# Patient Record
Sex: Female | Born: 1990 | Race: White | Hispanic: No | Marital: Single | Smoking: Never smoker
Health system: Southern US, Community
[De-identification: ages and names within clinical notes are randomized; demographics above are authoritative.]

## PROBLEM LIST (undated history)

## (undated) DIAGNOSIS — F419 Anxiety disorder, unspecified: Secondary | ICD-10-CM

## (undated) DIAGNOSIS — N189 Chronic kidney disease, unspecified: Secondary | ICD-10-CM

## (undated) DIAGNOSIS — S83519A Sprain of anterior cruciate ligament of unspecified knee, initial encounter: Secondary | ICD-10-CM

## (undated) HISTORY — PX: NO PAST SURGERIES: SHX2092

---

## 2013-07-20 ENCOUNTER — Emergency Department (HOSPITAL_BASED_OUTPATIENT_CLINIC_OR_DEPARTMENT_OTHER): Payer: BC Managed Care – PPO

## 2013-07-20 ENCOUNTER — Emergency Department (HOSPITAL_BASED_OUTPATIENT_CLINIC_OR_DEPARTMENT_OTHER)
Admission: EM | Admit: 2013-07-20 | Discharge: 2013-07-20 | Disposition: A | Discharge status: Home / Self Care | Payer: BC Managed Care – PPO | Attending: Emergency Medicine | Admitting: Emergency Medicine

## 2013-07-20 ENCOUNTER — Encounter (HOSPITAL_BASED_OUTPATIENT_CLINIC_OR_DEPARTMENT_OTHER): Payer: Self-pay | Admitting: Emergency Medicine

## 2013-07-20 DIAGNOSIS — B9689 Other specified bacterial agents as the cause of diseases classified elsewhere: Secondary | ICD-10-CM

## 2013-07-20 DIAGNOSIS — Z3202 Encounter for pregnancy test, result negative: Secondary | ICD-10-CM | POA: Insufficient documentation

## 2013-07-20 DIAGNOSIS — R109 Unspecified abdominal pain: Secondary | ICD-10-CM | POA: Insufficient documentation

## 2013-07-20 DIAGNOSIS — Z79899 Other long term (current) drug therapy: Secondary | ICD-10-CM | POA: Insufficient documentation

## 2013-07-20 DIAGNOSIS — N760 Acute vaginitis: Secondary | ICD-10-CM | POA: Insufficient documentation

## 2013-07-20 DIAGNOSIS — N390 Urinary tract infection, site not specified: Secondary | ICD-10-CM | POA: Insufficient documentation

## 2013-07-20 DIAGNOSIS — N23 Unspecified renal colic: Secondary | ICD-10-CM

## 2013-07-20 LAB — URINALYSIS, ROUTINE W REFLEX MICROSCOPIC
Bilirubin Urine: NEGATIVE
Glucose, UA: NEGATIVE mg/dL
Ketones, ur: NEGATIVE mg/dL
Nitrite: NEGATIVE
Specific Gravity, Urine: 1.015 (ref 1.005–1.030)
Urobilinogen, UA: 0.2 mg/dL (ref 0.0–1.0)
pH: 6 (ref 5.0–8.0)

## 2013-07-20 LAB — CBC
HCT: 40.8 % (ref 36.0–46.0)
MCH: 27.8 pg (ref 26.0–34.0)
MCV: 83.3 fL (ref 78.0–100.0)
Platelets: 295 10*3/uL (ref 150–400)
RBC: 4.9 MIL/uL (ref 3.87–5.11)
WBC: 9.9 10*3/uL (ref 4.0–10.5)

## 2013-07-20 LAB — URINE MICROSCOPIC-ADD ON

## 2013-07-20 LAB — BASIC METABOLIC PANEL
BUN: 11 mg/dL (ref 6–23)
CO2: 25 mEq/L (ref 19–32)
Calcium: 9.3 mg/dL (ref 8.4–10.5)
Chloride: 102 mEq/L (ref 96–112)
Creatinine, Ser: 0.7 mg/dL (ref 0.50–1.10)
Glucose, Bld: 95 mg/dL (ref 70–99)

## 2013-07-20 LAB — WET PREP, GENITAL
Trich, Wet Prep: NONE SEEN
Yeast Wet Prep HPF POC: NONE SEEN

## 2013-07-20 LAB — PREGNANCY, URINE: Preg Test, Ur: NEGATIVE

## 2013-07-20 MED ORDER — METRONIDAZOLE 500 MG PO TABS: 500.0000 mg | ORAL_TABLET | Freq: Two times a day (BID) | ORAL | Status: AC

## 2013-07-20 MED ORDER — CEPHALEXIN 500 MG PO CAPS: 500.0000 mg | ORAL_CAPSULE | Freq: Three times a day (TID) | ORAL | Status: DC

## 2013-07-20 MED ORDER — KETOROLAC TROMETHAMINE 60 MG/2ML IM SOLN
60.0000 mg | Freq: Once | INTRAMUSCULAR | Status: AC
Start: 2013-07-20 — End: 2013-07-20
  Administered 2013-07-20: 60 mg via INTRAMUSCULAR
  Filled 2013-07-20: qty 2

## 2013-07-20 MED ORDER — PHENAZOPYRIDINE HCL 200 MG PO TABS: 200.0000 mg | ORAL_TABLET | Freq: Two times a day (BID) | ORAL | Status: AC | PRN

## 2013-07-20 NOTE — ED Provider Notes (Signed)
I saw and evaluated the patient, reviewed the resident's note and I agree with the findings and plan. Patient is a 22 year old female who presents to the emergency department with complaints of burning with urination and right flank pain for the past 2 days. She denies any fevers or chills. She denies any nausea vomiting or diarrhea. Last menstrual period was last month and was normal.  On exam, the patient is afebrile and vitals are stable. Heart is regular rate and rhythm and lungs are clear. Abdomen is soft, nontender, nondistended. There is no flank pain. Extremities are without edema. Pelvic exam was performed (please see Dr. Felipa Emory note).  Patient presents here with symptoms of flank pain and urinary frequency which are suspicious for renal calculus or early pyelonephritis. UA reveals RBCs as well as leukocytes. CT scan shows mild fullness of the right renal collecting system. I am uncertain as to whether or not she had a recently passed kidney stone or whether this represents pyelonephritis, however she will be treated with antibiotics and pyridium. We will also prescribe Flagyl as she is noted to have clue cells on her pelvic exam.      Geoffery Lyons, MD 07/20/13 1451

## 2013-07-20 NOTE — ED Provider Notes (Signed)
CSN: [id number]     Arrival date & time 07/20/13  1036 History   First MD Initiated Contact with Patient 07/20/13 1106     Chief Complaint  Patient presents with  . possible uti and low back pain    (Consider location/radiation/quality/duration/timing/severity/associated sxs/prior Treatment) HPI  22 year old female here with 2 days of dysuria and urinary urgency. She states she was seen at her PCPs office one day ago and found to have a negative UA except for blood. Overnight her pain changed from dysuria 2 right-sided flank pain described as a 7/10 achy shooting pain radiating to her right groin at its worst and coming down to 2/10 all the time. Overnight her pains become more generalized in her mid back. She denies fevers, chills, sweats, appetite change. She has seen grossly bloody urine this morning.   History reviewed. No pertinent past medical history. History reviewed. No pertinent past surgical history. History reviewed. No pertinent family history. History  Substance Use Topics  . Smoking status: Never Smoker   . Smokeless tobacco: Not on file  . Alcohol Use: Yes   OB History   Grav Para Term Preterm Abortions TAB SAB Ect Mult Living                 Review of Systems  Constitutional: Negative for fever, chills and appetite change.  HENT: Negative for sore throat.   Respiratory: Negative for cough and shortness of breath.   Cardiovascular: Negative for chest pain.  Gastrointestinal: Negative for abdominal pain.  Genitourinary: Positive for dysuria, hematuria and flank pain.  Musculoskeletal: Positive for back pain.  Skin: Negative for rash.  Neurological: Negative for headaches.  All other systems reviewed and are negative.    Allergies  Review of patient's allergies indicates no known allergies.  Home Medications   Current Outpatient Rx  Name  Route  Sig  Dispense  Refill  . cephALEXin (KEFLEX) 500 MG capsule   Oral   Take 1 capsule (500 mg total) by mouth 3  (three) times daily.   21 capsule   0   . metroNIDAZOLE (FLAGYL) 500 MG tablet   Oral   Take 1 tablet (500 mg total) by mouth 2 (two) times daily.   14 tablet   0   . phenazopyridine (PYRIDIUM) 200 MG tablet   Oral   Take 1 tablet (200 mg total) by mouth 2 (two) times daily as needed for pain.   6 tablet   0    BP 148/96  Pulse 103  Temp(Src) 98.7 F (37.1 C) (Oral)  Resp 20  Ht 5\' 6"  (1.676 m)  Wt 285 lb (129.275 kg)  BMI 46.02 kg/m2  SpO2 97%  LMP 07/03/2013 Physical Exam  Nursing note and vitals reviewed. Constitutional: She is oriented to person, place, and time. She appears well-developed and well-nourished. No distress.  HENT:  Head: Normocephalic and atraumatic.  Eyes: EOM are normal. Pupils are equal, round, and reactive to light.  Neck: Neck supple.  Cardiovascular: Normal rate, regular rhythm and normal heart sounds.   No murmur heard. Pulmonary/Chest: Effort normal and breath sounds normal.  Abdominal: Soft. Bowel sounds are normal. There is no tenderness. There is no guarding.  Genitourinary: There is no rash on the right labia. There is no rash on the left labia. Cervix exhibits no motion tenderness, no discharge and no friability. Right adnexum displays no mass, no tenderness and no fullness. Left adnexum displays no mass, no tenderness and no fullness.  Musculoskeletal:  She exhibits no edema.  Neurological: She is alert and oriented to person, place, and time.  Skin: Skin is warm and dry. She is not diaphoretic.  Psychiatric: She has a normal mood and affect.    ED Course  Procedures (including critical care time) Labs Review Labs Reviewed  WET PREP, GENITAL - Abnormal; Notable for the following:    Clue Cells Wet Prep HPF POC FEW (*)    WBC, Wet Prep HPF POC FEW (*)    All other components within normal limits  URINALYSIS, ROUTINE W REFLEX MICROSCOPIC - Abnormal; Notable for the following:    APPearance CLOUDY (*)    Hgb urine dipstick LARGE (*)     Protein, ur 100 (*)    Leukocytes, UA TRACE (*)    All other components within normal limits  URINE MICROSCOPIC-ADD ON - Abnormal; Notable for the following:    Squamous Epithelial / LPF FEW (*)    Bacteria, UA FEW (*)    All other components within normal limits  GC/CHLAMYDIA PROBE AMP  PREGNANCY, URINE  CBC  BASIC METABOLIC PANEL  RPR  HIV ANTIBODY (ROUTINE TESTING)   Imaging Review Ct Abdomen Pelvis Wo Contrast  07/20/2013   CLINICAL DATA:  UTI symptoms  EXAM: CT ABDOMEN AND PELVIS WITHOUT CONTRAST  TECHNIQUE: Multidetector CT imaging of the abdomen and pelvis was performed following the standard protocol without intravenous contrast.  COMPARISON:  None.  FINDINGS: Lung bases are free of acute infiltrate or effusion.  The liver, gallbladder, spleen, adrenal glands and pancreas are normal in their CT appearance. The left kidney is well visualized without renal calculi. No obstructive changes in the left ureter are seen. The right kidney shows no renal calculi. Mild fullness of the right collecting system and proximal ureter are seen. No definitive right ureteral stone is seen.  The appendix is within normal limits. Mild diverticular change is seen without diverticulitis. The bladder is well distended. No pelvic mass lesion or sidewall abnormality is seen. The osseous structures are grossly unremarkable.  IMPRESSION: Mild fullness of the right renal collecting system and proximal right ureter without definitive stone. No other focal abnormality is noted.   Electronically Signed   By: Alcide Clever M.D.   On: 07/20/2013 12:12    EKG Interpretation   None       MDM   1. Renal colic on right side   2. BV (bacterial vaginosis)   3. UTI (lower urinary tract infection)     22 year old female here with right flank pain and hematuria concerning for renal stone. UA with large blood, trace leukocytes and 21-50 white cells on microscopic exam. CT scan with dilated right urethra but no  apparent stone., Wet prep with few clue cells. CBC and BMP within normal limits  Discussed results with patient and explained that it appears most likely situation is that she has passed a kidney stone which is consistent with her story of sharp flank pain yesterday that he become more generalized back pain today. Also is 21-50 white cells in the urine we'll treat as UTI. Also offered to treat BV with clue cells, patient would prefer to be treated.  Given Rx for Pyridium, Keflex, Flagyl.  Red flags reviewed, diastolic up with PCP for workup for stone if this repeats and per her request for birth control. Return for worsening symptoms.  Murtis Sink, MD [facility] Resident, PGY-2 07/20/2013, 1:02 PM       Elenora Gamma, MD  07/20/13 1302 

## 2013-07-20 NOTE — ED Notes (Signed)
uti symptoms x 3 days states she went to doctor yesterday and they did urinalysis reported that they told her she had some blood in her urine prescribed motrin for pain states she is still having low back pain also states not time for her period but when went to bathroom had some blood with tissue in it

## 2013-07-20 NOTE — ED Notes (Signed)
MD at bedside. 

## 2013-07-21 LAB — URINE CULTURE

## 2013-07-21 LAB — GC/CHLAMYDIA PROBE AMP: CT Probe RNA: NEGATIVE

## 2014-09-24 ENCOUNTER — Encounter (HOSPITAL_BASED_OUTPATIENT_CLINIC_OR_DEPARTMENT_OTHER): Payer: Self-pay | Admitting: Emergency Medicine

## 2014-09-24 ENCOUNTER — Emergency Department (HOSPITAL_BASED_OUTPATIENT_CLINIC_OR_DEPARTMENT_OTHER)
Admission: EM | Admit: 2014-09-24 | Discharge: 2014-09-24 | Disposition: A | Discharge status: Home / Self Care | Payer: BLUE CROSS/BLUE SHIELD | Attending: Emergency Medicine | Admitting: Emergency Medicine

## 2014-09-24 ENCOUNTER — Emergency Department (HOSPITAL_BASED_OUTPATIENT_CLINIC_OR_DEPARTMENT_OTHER): Payer: BLUE CROSS/BLUE SHIELD

## 2014-09-24 DIAGNOSIS — Z792 Long term (current) use of antibiotics: Secondary | ICD-10-CM | POA: Diagnosis not present

## 2014-09-24 DIAGNOSIS — S99912A Unspecified injury of left ankle, initial encounter: Secondary | ICD-10-CM | POA: Diagnosis present

## 2014-09-24 DIAGNOSIS — X58XXXA Exposure to other specified factors, initial encounter: Secondary | ICD-10-CM | POA: Diagnosis not present

## 2014-09-24 DIAGNOSIS — Y9289 Other specified places as the place of occurrence of the external cause: Secondary | ICD-10-CM | POA: Insufficient documentation

## 2014-09-24 DIAGNOSIS — T1490XA Injury, unspecified, initial encounter: Secondary | ICD-10-CM

## 2014-09-24 DIAGNOSIS — Z79899 Other long term (current) drug therapy: Secondary | ICD-10-CM | POA: Diagnosis not present

## 2014-09-24 DIAGNOSIS — S93409A Sprain of unspecified ligament of unspecified ankle, initial encounter: Secondary | ICD-10-CM

## 2014-09-24 DIAGNOSIS — Y9389 Activity, other specified: Secondary | ICD-10-CM | POA: Diagnosis not present

## 2014-09-24 DIAGNOSIS — Y998 Other external cause status: Secondary | ICD-10-CM | POA: Diagnosis not present

## 2014-09-24 DIAGNOSIS — S93402A Sprain of unspecified ligament of left ankle, initial encounter: Secondary | ICD-10-CM | POA: Diagnosis not present

## 2014-09-24 MED ORDER — HYDROCODONE-ACETAMINOPHEN 5-325 MG PO TABS: 2.0000 | ORAL_TABLET | ORAL | Status: DC | PRN

## 2014-09-24 NOTE — ED Provider Notes (Signed)
CSN: [id number]     Arrival date & time 09/24/14  2121 History  This chart was scribed for Rolland PorterMark Lamon Rotundo, MD by Evon Slackerrance Branch, ED Scribe. This patient was seen in room MH11/MH11 and the patient's care was started at 10:09 PM.      Chief Complaint  Patient presents with  . Ankle Injury   The history is provided by the patient. No language interpreter was used.   HPI Comments: Andrea Moon is a 24 y.o. female who presents to the Emergency Department complaining new sudden of left ankle pain onset 1 day prior. Pt states she has associated slight gait problem. Pt state she was walking in high heels and twisted her ankle. Pt doesn't report any other symptoms.   History reviewed. No pertinent past medical history. History reviewed. No pertinent past surgical history. History reviewed. No pertinent family history. History  Substance Use Topics  . Smoking status: Never Smoker   . Smokeless tobacco: Not on file  . Alcohol Use: Yes   OB History    No data available      Review of Systems  Constitutional: Negative for fever, chills, diaphoresis, appetite change and fatigue.  HENT: Negative for mouth sores, sore throat and trouble swallowing.   Eyes: Negative for visual disturbance.  Respiratory: Negative for cough, chest tightness, shortness of breath and wheezing.   Cardiovascular: Negative for chest pain.  Gastrointestinal: Negative for nausea, vomiting, abdominal pain, diarrhea and abdominal distention.  Endocrine: Negative for polydipsia, polyphagia and polyuria.  Genitourinary: Negative for dysuria, frequency and hematuria.  Musculoskeletal: Positive for joint swelling, arthralgias and gait problem.  Skin: Negative for color change, pallor and rash.  Neurological: Negative for dizziness, syncope, light-headedness and headaches.  Hematological: Does not bruise/bleed easily.  Psychiatric/Behavioral: Negative for behavioral problems and confusion.      Allergies  Review of  patient's allergies indicates no known allergies.  Home Medications   Prior to Admission medications   Medication Sig Start Date End Date Taking? Authorizing Provider  cephALEXin (KEFLEX) 500 MG capsule Take 1 capsule (500 mg total) by mouth 3 (three) times daily. 07/20/13   Elenora GammaSamuel L Bradshaw, MD  HYDROcodone-acetaminophen (NORCO/VICODIN) 5-325 MG per tablet Take 2 tablets by mouth every 4 (four) hours as needed. 09/24/14   Rolland PorterMark Breydon Senters, MD  metroNIDAZOLE (FLAGYL) 500 MG tablet Take 1 tablet (500 mg total) by mouth 2 (two) times daily. 07/20/13   Elenora GammaSamuel L Bradshaw, MD  phenazopyridine (PYRIDIUM) 200 MG tablet Take 1 tablet (200 mg total) by mouth 2 (two) times daily as needed for pain. 07/20/13   Elenora GammaSamuel L Bradshaw, MD   BP 135/94 mmHg  Pulse 111  Temp(Src) 98.3 F (36.8 C) (Oral)  Resp 18  Ht 5\' 5"  (1.651 m)  Wt 275 lb (124.739 kg)  BMI 45.76 kg/m2  SpO2 100%  LMP 08/27/2014   Physical Exam  Constitutional: She is oriented to person, place, and time. She appears well-developed and well-nourished. No distress.  HENT:  Head: Normocephalic.  Eyes: Conjunctivae are normal. Pupils are equal, round, and reactive to light. No scleral icterus.  Neck: Normal range of motion. Neck supple. No thyromegaly present.  Cardiovascular: Normal rate and regular rhythm.  Exam reveals no gallop and no friction rub.   No murmur heard. Pulmonary/Chest: Effort normal and breath sounds normal. No respiratory distress. She has no wheezes. She has no rales.  Abdominal: Soft. Bowel sounds are normal. She exhibits no distension. There is no tenderness. There is no rebound.  Musculoskeletal: Normal range of motion. She exhibits no tenderness.  Non tender over 5ht metatarsal or medial foot, soft tissue swelling over ATF ligament. Non tender over proximal fibula.   Neurological: She is alert and oriented to person, place, and time.  Skin: Skin is warm and dry. No rash noted.  Psychiatric: She has a normal mood and  affect. Her behavior is normal.  Nursing note and vitals reviewed.   ED Course  Procedures (including critical care time) DIAGNOSTIC STUDIES: Oxygen Saturation is 100% on RA, normal by my interpretation.    COORDINATION OF CARE: 10:37 PM-Discussed treatment plan with pt at bedside and pt agreed to plan.     Labs Review Labs Reviewed - No data to display  Imaging Review Dg Ankle Complete Left  09/24/2014   CLINICAL DATA:  Pt states she went out last night in a pair of heels and twisted her ankle, pt c.o pain in entire left ankle, swelling  EXAM: LEFT ANKLE COMPLETE - 3+ VIEW  COMPARISON:  None.  FINDINGS: No fracture. Ankle mortise is normally spaced and aligned. No arthropathic change. Mild lateral soft tissue edema.  IMPRESSION: No fracture or ankle joint abnormality.   Electronically Signed   By: Amie Portland M.D.   On: 09/24/2014 22:06     EKG Interpretation None      MDM   Final diagnoses:  Ankle sprain, unspecified laterality, initial encounter   No fracture noted on x-ray.. Discussed care and treatment. Appropriate precautions and follow-up given.   I personally performed the services described in this documentation, which was scribed in my presence. The recorded information has been reviewed and is accurate.      Rolland Porter, MD 09/24/14 2253

## 2014-09-24 NOTE — ED Notes (Signed)
Pt states she went out last night in a pair of heels and twisted her ankle.

## 2014-09-24 NOTE — Discharge Instructions (Signed)

## 2014-11-05 ENCOUNTER — Emergency Department (HOSPITAL_BASED_OUTPATIENT_CLINIC_OR_DEPARTMENT_OTHER): Payer: BLUE CROSS/BLUE SHIELD

## 2014-11-05 ENCOUNTER — Encounter (HOSPITAL_BASED_OUTPATIENT_CLINIC_OR_DEPARTMENT_OTHER): Payer: Self-pay | Admitting: Emergency Medicine

## 2014-11-05 ENCOUNTER — Emergency Department (HOSPITAL_BASED_OUTPATIENT_CLINIC_OR_DEPARTMENT_OTHER)
Admission: EM | Admit: 2014-11-05 | Discharge: 2014-11-05 | Disposition: A | Discharge status: Home / Self Care | Payer: BLUE CROSS/BLUE SHIELD | Attending: Emergency Medicine | Admitting: Emergency Medicine

## 2014-11-05 DIAGNOSIS — Y9289 Other specified places as the place of occurrence of the external cause: Secondary | ICD-10-CM | POA: Insufficient documentation

## 2014-11-05 DIAGNOSIS — S8991XA Unspecified injury of right lower leg, initial encounter: Secondary | ICD-10-CM | POA: Diagnosis present

## 2014-11-05 DIAGNOSIS — Y998 Other external cause status: Secondary | ICD-10-CM | POA: Diagnosis not present

## 2014-11-05 DIAGNOSIS — S86911A Strain of unspecified muscle(s) and tendon(s) at lower leg level, right leg, initial encounter: Secondary | ICD-10-CM

## 2014-11-05 DIAGNOSIS — Y9389 Activity, other specified: Secondary | ICD-10-CM | POA: Diagnosis not present

## 2014-11-05 DIAGNOSIS — S86811A Strain of other muscle(s) and tendon(s) at lower leg level, right leg, initial encounter: Secondary | ICD-10-CM | POA: Insufficient documentation

## 2014-11-05 DIAGNOSIS — X58XXXA Exposure to other specified factors, initial encounter: Secondary | ICD-10-CM | POA: Insufficient documentation

## 2014-11-05 DIAGNOSIS — Z792 Long term (current) use of antibiotics: Secondary | ICD-10-CM | POA: Diagnosis not present

## 2014-11-05 DIAGNOSIS — R52 Pain, unspecified: Secondary | ICD-10-CM

## 2014-11-05 MED ORDER — IBUPROFEN 800 MG PO TABS: 800.0000 mg | ORAL_TABLET | Freq: Three times a day (TID) | ORAL | Status: AC

## 2014-11-05 MED ORDER — IBUPROFEN 800 MG PO TABS
800.0000 mg | ORAL_TABLET | Freq: Once | ORAL | Status: AC
  Administered 2014-11-05: 800 mg via ORAL
  Filled 2014-11-05: qty 1

## 2014-11-05 NOTE — Discharge Instructions (Signed)
Crutch Use Use your crutches to help you to keep weight off of your right leg. Take the medication as needed for pain. Place an ice pack on your right knee 4 times daily for 30 minutes at a time and keep her leg elevated above your heart as much as possible to prevent swelling. Call Dr.Hudnall to schedule an office appointment if you're having significant pain or difficulty walking in 3 days Crutches are used to take weight off one of your legs or feet when you stand or walk. It is important to use crutches that fit properly. When fitted properly:  Each crutch should be 2-3 finger widths below the armpit.  Your weight should be supported by your hand, and not by resting the armpit on the crutch.  RISKS AND COMPLICATIONS Damage to the nerves that extend from your armpit to your hand and arm. To prevent this from happening, make sure your crutches fit properly and do not put pressure on your armpit when using them. HOW TO USE YOUR CRUTCHES If you have been instructed to use partial weight bearing, apply (bear) the amount of weight as your health care provider suggests. Do not bear weight in an amount that causes pain to the area of injury. Walking 1. Step with the crutches. 2. Swing the healthy leg slightly ahead of the crutches. Going Up Steps If there is no handrail: 1. Step up with the healthy leg. 2. Step up with the crutches and injured leg. 3. Continue in this way. If there is a handrail: 1. Hold both crutches in one hand. 2. Place your free hand on the handrail. 3. While putting your weight on your arms, lift your healthy leg to the step. 4. Bring the crutches and the injured leg up to that step. 5. Continue in this way. Going Down Steps Be very careful, as going down stairs with crutches is very challenging. If there is no handrail: 1. Step down with the injured leg and crutches. 2. Step down with the healthy leg. If there is a handrail: 1. Place your hand on the  handrail. 2. Hold both crutches with your free hand. 3. Lower your injured leg and crutch to the step below you. Make sure to keep the crutch tips in the center of the step, never on the edge. 4. Lower your healthy leg to that step. 5. Continue in this way. Standing Up 1. Hold the injured leg forward. 2. Grab the armrest with one hand and the top of the crutches with the other hand. 3. Using these supports, pull yourself up to a standing position. Sitting Down 1. Hold the injured leg forward. 2. Grab the armrest with one hand and the top of the crutches with the other hand. 3. Lower yourself to a sitting position. SEEK MEDICAL CARE IF:  You still feel unsteady on your feet.  You develop new pain, for example in your armpits, back, shoulder, wrist, or hip.  You develop any numbness or tingling. SEEK IMMEDIATE MEDICAL CARE IF: You fall. Document Released: 08/20/2000 Document Revised: 08/28/2013 Document Reviewed: 04/30/2013 [facility] Patient Information 2015 [employer], [place]. This information is not intended to replace advice given to you by your health care provider. Make sure you discuss any questions you have with your health care provider.

## 2014-11-05 NOTE — ED Provider Notes (Signed)
CSN: [id number]     Arrival date & time 11/05/14  4098 History   First MD Initiated Contact with Patient 11/05/14 [phone]     Chief Complaint  Patient presents with  . Knee Pain     (Consider location/radiation/quality/duration/timing/severity/associated sxs/prior Treatment) HPI Complains of right knee pain onset last night when she was "play fighting" pain is worse with moving her knee improved with remaining still. She has been ambulatory since the event. No treatment prior to coming here. Pain moderate at present. History reviewed. No pertinent past medical history. past medical history negative History reviewed. No pertinent past surgical history. History reviewed. No pertinent family history. History  Substance Use Topics  . Smoking status: Never Smoker   . Smokeless tobacco: Not on file  . Alcohol Use: Yes     Comment: "a few times a week"    OB History    No data available     Review of Systems  Constitutional: Negative.   Musculoskeletal: Positive for arthralgias.       Right knee pain      Allergies  Review of patient's allergies indicates no known allergies.  Home Medications   Prior to Admission medications   Medication Sig Start Date End Date Taking? Authorizing Provider  Pseudoeph-Doxylamine-DM-APAP (NYQUIL PO) Take by mouth.   Yes Historical Provider, MD  cephALEXin (KEFLEX) 500 MG capsule Take 1 capsule (500 mg total) by mouth 3 (three) times daily. 07/20/13   Elenora Gamma, MD  HYDROcodone-acetaminophen (NORCO/VICODIN) 5-325 MG per tablet Take 2 tablets by mouth every 4 (four) hours as needed. 09/24/14   Rolland Porter, MD  metroNIDAZOLE (FLAGYL) 500 MG tablet Take 1 tablet (500 mg total) by mouth 2 (two) times daily. 07/20/13   Elenora Gamma, MD  phenazopyridine (PYRIDIUM) 200 MG tablet Take 1 tablet (200 mg total) by mouth 2 (two) times daily as needed for pain. 07/20/13   Elenora Gamma, MD   BP 133/89 mmHg  Pulse 90  Temp(Src) 98.4 F (36.9 C)  (Oral)  Resp 20  Ht  (1.676 m)  Wt 250 lb (113.399 kg)  BMI 40.37 kg/m2  SpO2 100%  LMP 10/26/2014 Physical Exam  Constitutional: She is oriented to person, place, and time. She appears well-developed and well-nourished. No distress.  HENT:  Head: Normocephalic and atraumatic.  Eyes: Conjunctivae and EOM are normal.  Neck: Neck supple. No thyromegaly present.  Cardiovascular: Normal rate.   Pulmonary/Chest: Effort normal.  Abdominal:  Obese  Musculoskeletal: Normal range of motion. She exhibits no edema.  Right lower extremity no swelling no deformity. She complains of knee pain on varus and valgus stress and upon performing Lachman's test and posterior drawer test. No ligamentous laxity. DP pulse 2+.   Neurological: She is alert and oriented to person, place, and time. Coordination normal.  Walks with slight limp favoring right leg  Skin: Skin is warm and dry. No rash noted.  Psychiatric: She has a normal mood and affect.  Nursing note and vitals reviewed.   ED Course  Procedures (including critical care time) Labs Review Labs Reviewed - No data to display  Imaging Review No results found.   EKG Interpretation None     9:45 AM reports no pain relief after treatment with ibuprofen, however she does feel ready to go home. X-ray viewed by me Results for orders placed or performed during the hospital encounter of 07/20/13  GC/Chlamydia Probe Amp  Result Value Ref Range   CT Probe RNA  NEGATIVE NEGATIVE   GC Probe RNA NEGATIVE NEGATIVE  Wet prep, genital  Result Value Ref Range   Yeast Wet Prep HPF POC NONE SEEN NONE SEEN   Trich, Wet Prep NONE SEEN NONE SEEN   Clue Cells Wet Prep HPF POC FEW (A) NONE SEEN   WBC, Wet Prep HPF POC FEW (A) NONE SEEN  Urine culture  Result Value Ref Range   Specimen Description URINE, CLEAN CATCH    Special Requests NONE    Culture  Setup Time      07/20/2013 21:52 [facility]   Culture      Multiple  bacterial morphotypes present, none predominant. Suggest appropriate recollection if clinically indicated. Performed at [facility]   Report Status 07/21/2013 FINAL   Urinalysis, Routine w reflex microscopic  Result Value Ref Range   Color, Urine YELLOW YELLOW   APPearance CLOUDY (A) CLEAR   Specific Gravity, Urine 1.015 1.005 - 1.030   pH 6.0 5.0 - 8.0   Glucose, UA NEGATIVE NEGATIVE mg/dL   Hgb urine dipstick LARGE (A) NEGATIVE   Bilirubin Urine NEGATIVE NEGATIVE   Ketones, ur NEGATIVE NEGATIVE mg/dL   Protein, ur 161100 (A) NEGATIVE mg/dL   Urobilinogen, UA 0.2 0.0 - 1.0 mg/dL   Nitrite NEGATIVE NEGATIVE   Leukocytes, UA TRACE (A) NEGATIVE  Pregnancy, urine  Result Value Ref Range   Preg Test, Ur NEGATIVE NEGATIVE  RPR  Result Value Ref Range   RPR Ser Ql NON REACTIVE NON REACTIVE  HIV antibody  Result Value Ref Range   HIV NON REACTIVE NON REACTIVE  CBC  Result Value Ref Range   WBC 9.9 4.0 - 10.5 K/uL   RBC 4.90 3.87 - 5.11 MIL/uL   Hemoglobin 13.6 12.0 - 15.0 g/dL   HCT 09.640.8 04.536.0 - 40.946.0 %   MCV 83.3 78.0 - 100.0 fL   MCH 27.8 26.0 - 34.0 pg   MCHC 33.3 30.0 - 36.0 g/dL   RDW 81.114.7 91.411.5 - 78.215.5 %   Platelets 295 150 - 400 K/uL  Basic metabolic panel  Result Value Ref Range   Sodium 138 135 - 145 mEq/L   Potassium 3.8 3.5 - 5.1 mEq/L   Chloride 102 96 - 112 mEq/L   CO2 25 19 - 32 mEq/L   Glucose, Bld 95 70 - 99 mg/dL   BUN 11 6 - 23 mg/dL   Creatinine, Ser 9.560.70 0.50 - 1.10 mg/dL   Calcium 9.3 8.4 - 21.310.5 mg/dL   GFR calc non Af Amer >90 >90 mL/min   GFR calc Af Amer >90 >90 mL/min  Urine microscopic-add on  Result Value Ref Range   Squamous Epithelial / LPF FEW (A) RARE   WBC, UA 21-50 <3 WBC/hpf   RBC / HPF TOO NUMEROUS TO COUNT <3 RBC/hpf   Bacteria, UA FEW (A) RARE   Dg Knee Complete 4 Views Right  11/05/2014   CLINICAL DATA:  Knee  EXAM: RIGHT KNEE - COMPLETE 4+ VIEW  COMPARISON:  None.  FINDINGS: Four views of the right knee submitted. No acute  fracture or subluxation. Minimal narrowing of medial joint compartment. No joint effusion.  IMPRESSION: No acute fracture or subluxation. Minimal narrowing of medial joint compartment. No joint effusion.   Electronically Signed   By: Natasha MeadLiviu  Pop M.D.   On: 11/05/2014 09:21    MDM  Patient has crutches Plan prescription ibuprofen Referral Dr. Freddie ApleyHudnall,crutches with partial weight bearing Diagnosis strain of right knee  Final diagnoses:  None        Doug Sou, MD 11/05/14 [phone]

## 2014-11-05 NOTE — ED Notes (Signed)
Pt comes in today with a c/o right knee pain. Pt states that she twisted her right knee while play fighting. Pt states that she can bear some weight on her leg.

## 2014-11-15 ENCOUNTER — Other Ambulatory Visit: Payer: Self-pay | Admitting: Orthopaedic Surgery

## 2014-11-15 DIAGNOSIS — M25561 Pain in right knee: Secondary | ICD-10-CM

## 2014-12-01 ENCOUNTER — Ambulatory Visit
Admission: RE | Admit: 2014-12-01 | Discharge: 2014-12-01 | Disposition: A | Discharge status: Home / Self Care | Payer: BLUE CROSS/BLUE SHIELD | Source: Ambulatory Visit | Attending: Orthopaedic Surgery | Admitting: Orthopaedic Surgery

## 2014-12-01 DIAGNOSIS — M25561 Pain in right knee: Secondary | ICD-10-CM

## 2014-12-03 ENCOUNTER — Other Ambulatory Visit (HOSPITAL_COMMUNITY): Payer: Self-pay | Admitting: Orthopaedic Surgery

## 2014-12-25 ENCOUNTER — Other Ambulatory Visit (HOSPITAL_COMMUNITY): Payer: Self-pay | Admitting: *Deleted

## 2014-12-26 ENCOUNTER — Encounter (HOSPITAL_COMMUNITY)
Admission: RE | Admit: 2014-12-26 | Discharge: 2014-12-26 | Disposition: A | Discharge status: Home / Self Care | Payer: BLUE CROSS/BLUE SHIELD | Source: Ambulatory Visit | Attending: Orthopaedic Surgery | Admitting: Orthopaedic Surgery

## 2014-12-26 ENCOUNTER — Encounter (HOSPITAL_COMMUNITY): Payer: Self-pay

## 2014-12-26 DIAGNOSIS — S83511A Sprain of anterior cruciate ligament of right knee, initial encounter: Secondary | ICD-10-CM | POA: Diagnosis not present

## 2014-12-26 DIAGNOSIS — X58XXXA Exposure to other specified factors, initial encounter: Secondary | ICD-10-CM | POA: Diagnosis not present

## 2014-12-26 DIAGNOSIS — Z6841 Body Mass Index (BMI) 40.0 and over, adult: Secondary | ICD-10-CM | POA: Diagnosis not present

## 2014-12-26 HISTORY — DX: Anxiety disorder, unspecified: F41.9

## 2014-12-26 HISTORY — DX: Sprain of anterior cruciate ligament of unspecified knee, initial encounter: S83.519A

## 2014-12-26 HISTORY — DX: Chronic kidney disease, unspecified: N18.9

## 2014-12-26 LAB — BASIC METABOLIC PANEL
Anion gap: 11 (ref 5–15)
BUN: 10 mg/dL (ref 6–23)
CALCIUM: 9.3 mg/dL (ref 8.4–10.5)
CO2: 22 mmol/L (ref 19–32)
Chloride: 105 mmol/L (ref 96–112)
Creatinine, Ser: 0.58 mg/dL (ref 0.50–1.10)
GFR calc Af Amer: >90 mL/min (ref >90)
Glucose, Bld: 116 mg/dL — ABNORMAL HIGH (ref 70–99)
Potassium: 4 mmol/L (ref 3.5–5.1)
Sodium: 138 mmol/L (ref 135–145)

## 2014-12-26 LAB — CBC
HEMATOCRIT: 41 % (ref 36.0–46.0)
Hemoglobin: 13.6 g/dL (ref 12.0–15.0)
MCH: 28.3 pg (ref 26.0–34.0)
MCHC: 33.2 g/dL (ref 30.0–36.0)
MCV: 85.2 fL (ref 78.0–100.0)
PLATELETS: 303 10*3/uL (ref 150–400)
RBC: 4.81 MIL/uL (ref 3.87–5.11)
RDW: 13.6 % (ref 11.5–15.5)
WBC: 7.9 10*3/uL (ref 4.0–10.5)

## 2014-12-26 LAB — HCG, SERUM, QUALITATIVE: Preg, Serum: NEGATIVE

## 2014-12-26 NOTE — Pre-Procedure Instructions (Signed)
Andrea Moon  12/26/2014   Your procedure is scheduled on:  01/01/2015  [facility]  [facility] A at 10:45 AM.  Call this number if you have problems the morning of surgery: [phone]   Remember:   Do not eat food or drink liquids after midnight.  On TUESDAY   Take these medicines the morning of surgery with A SIP OF WATER:     NONE   Do not wear jewelry, make-up or nail polish.   Do not wear lotions, powders, or perfumes. You may wear deodorant.   Do not shave 48 hours prior to surgery.    Do not bring valuables to the hospital.  [facility]                  for any belongings or valuables.               Contacts, dentures or bridgework may not be worn into surgery.   Leave suitcase in the car. After surgery it may be brought to your room.   For patients admitted to the hospital, discharge time is determined by your                treatment team.               Patients discharged the day of surgery will not be allowed to drive  home.  Name and phone number of your driver: with family  Special Instructions: Special Instructions: [facility] - Preparing for Surgery  Before surgery, you can play an important role.  Because skin is not sterile, your skin needs to be as free of germs as possible.  You can reduce the number of germs on you skin by washing with CHG (chlorahexidine gluconate) soap before surgery.  CHG is an antiseptic cleaner which kills germs and bonds with the skin to continue killing germs even after washing.  Please DO NOT use if you have an allergy to CHG or antibacterial soaps.  If your skin becomes reddened/irritated stop using the CHG and inform your nurse when you arrive at Short Stay.  Do not shave (including legs and underarms) for at least 48 hours prior to the first CHG shower.  You may shave your face.  Please follow these instructions carefully:   1.  Shower with CHG Soap the night before surgery and  the  morning of Surgery.  2.  If you choose to wash your hair, wash your hair first as usual with your  normal shampoo.  3.  After you shampoo, rinse your hair and body thoroughly to remove the  Shampoo.  4.  Use CHG as you would any other liquid soap.  You can apply chg directly to the skin and wash gently with scrungie or a clean washcloth.  5.  Apply the CHG Soap to your body ONLY FROM THE NECK DOWN.    Do not use on open wounds or open sores.  Avoid contact with your eyes, ears, mouth and genitals (private parts).  Wash genitals (private parts)   with your normal soap.  6.  Wash thoroughly, paying special attention to the area where your surgery will be performed.  7.  Thoroughly rinse your body with warm water from the neck down.  8.  DO NOT shower/wash with your normal soap after using and rinsing off   the CHG Soap.  9.  Pat yourself dry with a clean towel.  10.  Wear clean pajamas.            11.  Place clean sheets on your bed the night of your first shower and do not sleep with pets.  Day of Surgery  Do not apply any lotions/deodorants the morning of surgery.  Please wear clean clothes to the hospital/surgery center.   Please read over the following fact sheets that you were given: Pain Booklet, Coughing and Deep Breathing and Surgical Site Infection Prevention

## 2014-12-31 MED ORDER — CEFAZOLIN SODIUM-DEXTROSE 2-3 GM-% IV SOLR
2.0000 g | INTRAVENOUS | Status: AC
  Administered 2015-01-01: 3 g via INTRAVENOUS
  Filled 2014-12-31: qty 50

## 2015-01-01 ENCOUNTER — Ambulatory Visit (HOSPITAL_COMMUNITY): Payer: BLUE CROSS/BLUE SHIELD | Admitting: Certified Registered"

## 2015-01-01 ENCOUNTER — Encounter (HOSPITAL_COMMUNITY)
Admission: RE | Disposition: A | Discharge status: Home / Self Care | Payer: Self-pay | Source: Ambulatory Visit | Attending: Orthopaedic Surgery

## 2015-01-01 ENCOUNTER — Ambulatory Visit (HOSPITAL_COMMUNITY)
Admission: RE | Admit: 2015-01-01 | Discharge: 2015-01-01 | Disposition: A | Discharge status: Home / Self Care | Payer: BLUE CROSS/BLUE SHIELD | Source: Ambulatory Visit | Attending: Orthopaedic Surgery | Admitting: Orthopaedic Surgery

## 2015-01-01 ENCOUNTER — Encounter (HOSPITAL_COMMUNITY): Payer: Self-pay | Admitting: *Deleted

## 2015-01-01 DIAGNOSIS — Z6841 Body Mass Index (BMI) 40.0 and over, adult: Secondary | ICD-10-CM | POA: Insufficient documentation

## 2015-01-01 DIAGNOSIS — X58XXXA Exposure to other specified factors, initial encounter: Secondary | ICD-10-CM | POA: Insufficient documentation

## 2015-01-01 DIAGNOSIS — S83511A Sprain of anterior cruciate ligament of right knee, initial encounter: Secondary | ICD-10-CM | POA: Insufficient documentation

## 2015-01-01 HISTORY — PX: ANTERIOR CRUCIATE LIGAMENT REPAIR: SHX115

## 2015-01-01 SURGERY — RECONSTRUCTION, KNEE, ACL, USING HAMSTRING GRAFT
Anesthesia: General | Site: Knee | Laterality: Right

## 2015-01-01 MED ORDER — METHOCARBAMOL 750 MG PO TABS: 750.0000 mg | ORAL_TABLET | Freq: Two times a day (BID) | ORAL | Status: AC | PRN

## 2015-01-01 MED ORDER — FENTANYL CITRATE (PF) 100 MCG/2ML IJ SOLN
50.0000 ug | INTRAMUSCULAR | Status: DC | PRN
  Administered 2015-01-01: 50 ug via INTRAVENOUS

## 2015-01-01 MED ORDER — KETOROLAC TROMETHAMINE 30 MG/ML IJ SOLN
INTRAMUSCULAR | Status: AC
  Filled 2015-01-01: qty 1

## 2015-01-01 MED ORDER — DEXTROSE 5 % IV SOLN
1000.0000 mg | INTRAVENOUS | Status: AC
  Filled 2015-01-01: qty 10

## 2015-01-01 MED ORDER — HYDROMORPHONE HCL 1 MG/ML IJ SOLN
0.2500 mg | INTRAMUSCULAR | Status: DC | PRN
  Administered 2015-01-01: 0.25 mg via INTRAVENOUS
  Administered 2015-01-01: 1 mg via INTRAVENOUS

## 2015-01-01 MED ORDER — OXYCODONE HCL 5 MG PO TABS
ORAL_TABLET | ORAL | Status: AC
  Filled 2015-01-01: qty 3

## 2015-01-01 MED ORDER — FENTANYL CITRATE (PF) 100 MCG/2ML IJ SOLN: 50.0000 ug | INTRAMUSCULAR | Status: DC | PRN

## 2015-01-01 MED ORDER — KETOROLAC TROMETHAMINE 30 MG/ML IJ SOLN
30.0000 mg | Freq: Once | INTRAMUSCULAR | Status: AC | PRN
  Administered 2015-01-01: 30 mg via INTRAVENOUS

## 2015-01-01 MED ORDER — HYDROMORPHONE HCL 1 MG/ML IJ SOLN
INTRAMUSCULAR | Status: AC
  Administered 2015-01-01: 0.25 mg via INTRAVENOUS
  Filled 2015-01-01: qty 1

## 2015-01-01 MED ORDER — KETOROLAC TROMETHAMINE 30 MG/ML IJ SOLN
INTRAMUSCULAR | Status: DC | PRN
  Administered 2015-01-01: 30 mg via INTRAVENOUS

## 2015-01-01 MED ORDER — OXYCODONE HCL 5 MG PO TABS: 5.0000 mg | ORAL_TABLET | ORAL | Status: AC | PRN

## 2015-01-01 MED ORDER — ONDANSETRON HCL 4 MG/2ML IJ SOLN
INTRAMUSCULAR | Status: DC | PRN
  Administered 2015-01-01: 4 mg via INTRAVENOUS

## 2015-01-01 MED ORDER — SUCCINYLCHOLINE CHLORIDE 20 MG/ML IJ SOLN
INTRAMUSCULAR | Status: DC | PRN
  Administered 2015-01-01: 180 mg via INTRAVENOUS

## 2015-01-01 MED ORDER — FENTANYL CITRATE (PF) 250 MCG/5ML IJ SOLN
INTRAMUSCULAR | Status: AC
  Filled 2015-01-01: qty 5

## 2015-01-01 MED ORDER — FENTANYL CITRATE (PF) 100 MCG/2ML IJ SOLN
INTRAMUSCULAR | Status: DC | PRN
  Administered 2015-01-01: 25 ug via INTRAVENOUS
  Administered 2015-01-01: 150 ug via INTRAVENOUS
  Administered 2015-01-01: 25 ug via INTRAVENOUS
  Administered 2015-01-01: 150 ug via INTRAVENOUS
  Administered 2015-01-01 (×2): 50 ug via INTRAVENOUS

## 2015-01-01 MED ORDER — PHENYLEPHRINE 40 MCG/ML (10ML) SYRINGE FOR IV PUSH (FOR BLOOD PRESSURE SUPPORT)
PREFILLED_SYRINGE | INTRAVENOUS | Status: AC
  Filled 2015-01-01: qty 10

## 2015-01-01 MED ORDER — PROPOFOL 10 MG/ML IV BOLUS
INTRAVENOUS | Status: DC | PRN
  Administered 2015-01-01: 200 mg via INTRAVENOUS

## 2015-01-01 MED ORDER — GLYCOPYRROLATE 0.2 MG/ML IJ SOLN
INTRAMUSCULAR | Status: DC | PRN
  Administered 2015-01-01: .8 mg via INTRAVENOUS

## 2015-01-01 MED ORDER — FENTANYL CITRATE (PF) 100 MCG/2ML IJ SOLN
INTRAMUSCULAR | Status: AC
  Administered 2015-01-01: 50 ug via INTRAVENOUS
  Filled 2015-01-01: qty 2

## 2015-01-01 MED ORDER — NEOSTIGMINE METHYLSULFATE 10 MG/10ML IV SOLN
INTRAVENOUS | Status: AC
Start: 2015-01-01 — End: 2015-01-01
  Filled 2015-01-01: qty 1

## 2015-01-01 MED ORDER — PHENYLEPHRINE HCL 10 MG/ML IJ SOLN
INTRAMUSCULAR | Status: DC | PRN
  Administered 2015-01-01: 120 ug via INTRAVENOUS
  Administered 2015-01-01: 80 ug via INTRAVENOUS

## 2015-01-01 MED ORDER — SENNOSIDES-DOCUSATE SODIUM 8.6-50 MG PO TABS: 1.0000 | ORAL_TABLET | Freq: Every evening | ORAL | Status: AC | PRN

## 2015-01-01 MED ORDER — EPHEDRINE SULFATE 50 MG/ML IJ SOLN
INTRAMUSCULAR | Status: DC | PRN
  Administered 2015-01-01: 10 mg via INTRAVENOUS

## 2015-01-01 MED ORDER — METHOCARBAMOL 500 MG PO TABS
ORAL_TABLET | ORAL | Status: AC
  Filled 2015-01-01: qty 2

## 2015-01-01 MED ORDER — DEXAMETHASONE SODIUM PHOSPHATE 10 MG/ML IJ SOLN
INTRAMUSCULAR | Status: DC | PRN
  Administered 2015-01-01: 4 mg via INTRAVENOUS

## 2015-01-01 MED ORDER — ASPIRIN EC 325 MG PO TBEC
325.0000 mg | DELAYED_RELEASE_TABLET | Freq: Every day | ORAL | Status: AC
Start: 2015-01-01 — End: ?

## 2015-01-01 MED ORDER — HYDROMORPHONE HCL 1 MG/ML IJ SOLN
INTRAMUSCULAR | Status: AC
  Filled 2015-01-01: qty 1

## 2015-01-01 MED ORDER — 0.9 % SODIUM CHLORIDE (POUR BTL) OPTIME
TOPICAL | Status: DC | PRN
  Administered 2015-01-01: 1000 mL

## 2015-01-01 MED ORDER — ROCURONIUM BROMIDE 100 MG/10ML IV SOLN
INTRAVENOUS | Status: DC | PRN
  Administered 2015-01-01: 50 mg via INTRAVENOUS

## 2015-01-01 MED ORDER — OXYCODONE HCL ER 10 MG PO T12A: 10.0000 mg | EXTENDED_RELEASE_TABLET | Freq: Two times a day (BID) | ORAL | Status: AC

## 2015-01-01 MED ORDER — MIDAZOLAM HCL 2 MG/2ML IJ SOLN: 1.0000 mg | INTRAMUSCULAR | Status: DC | PRN

## 2015-01-01 MED ORDER — ONDANSETRON HCL 4 MG/2ML IJ SOLN
INTRAMUSCULAR | Status: AC
  Filled 2015-01-01: qty 4

## 2015-01-01 MED ORDER — LIDOCAINE HCL (CARDIAC) 20 MG/ML IV SOLN
INTRAVENOUS | Status: DC | PRN
  Administered 2015-01-01: 100 mg via INTRAVENOUS

## 2015-01-01 MED ORDER — PROMETHAZINE HCL 25 MG/ML IJ SOLN: 6.2500 mg | INTRAMUSCULAR | Status: DC | PRN

## 2015-01-01 MED ORDER — MIDAZOLAM HCL 2 MG/2ML IJ SOLN
1.0000 mg | INTRAMUSCULAR | Status: DC | PRN
  Administered 2015-01-01: 2 mg via INTRAVENOUS

## 2015-01-01 MED ORDER — LIDOCAINE HCL (CARDIAC) 20 MG/ML IV SOLN
INTRAVENOUS | Status: AC
  Filled 2015-01-01: qty 5

## 2015-01-01 MED ORDER — MIDAZOLAM HCL 2 MG/2ML IJ SOLN
INTRAMUSCULAR | Status: AC
  Administered 2015-01-01: 2 mg via INTRAVENOUS
  Filled 2015-01-01: qty 2

## 2015-01-01 MED ORDER — ROPIVACAINE HCL 5 MG/ML IJ SOLN
INTRAMUSCULAR | Status: DC | PRN
  Administered 2015-01-01: 30 mL via PERINEURAL

## 2015-01-01 MED ORDER — LACTATED RINGERS IV SOLN
INTRAVENOUS | Status: DC
  Administered 2015-01-01 (×2): via INTRAVENOUS

## 2015-01-01 MED ORDER — METHOCARBAMOL 500 MG PO TABS
750.0000 mg | ORAL_TABLET | Freq: Once | ORAL | Status: AC
  Administered 2015-01-01: 750 mg via ORAL

## 2015-01-01 MED ORDER — SODIUM CHLORIDE 0.9 % IR SOLN
Status: DC | PRN
  Administered 2015-01-01: 6000 mL

## 2015-01-01 MED ORDER — NEOSTIGMINE METHYLSULFATE 10 MG/10ML IV SOLN
INTRAVENOUS | Status: DC | PRN
  Administered 2015-01-01: 4 mg via INTRAVENOUS

## 2015-01-01 MED ORDER — OXYCODONE HCL 5 MG PO TABS
15.0000 mg | ORAL_TABLET | Freq: Once | ORAL | Status: AC
  Administered 2015-01-01: 15 mg via ORAL

## 2015-01-01 MED ORDER — GLYCOPYRROLATE 0.2 MG/ML IJ SOLN
INTRAMUSCULAR | Status: AC
  Filled 2015-01-01: qty 4

## 2015-01-01 SURGICAL SUPPLY — 77 items
ANCHOR BUTTON TIGHTROPE ACL RT (Orthopedic Implant) ×6 IMPLANT
BANDAGE ESMARK 6X9 LF (GAUZE/BANDAGES/DRESSINGS) ×1 IMPLANT
BLADE CUDA 5.5 (BLADE) IMPLANT
BLADE CUTTER GATOR 3.5 (BLADE) ×3 IMPLANT
BLADE GREAT WHITE 4.2 (BLADE) ×2 IMPLANT
BLADE GREAT WHITE 4.2MM (BLADE) ×1
BLADE SURG 10 STRL SS (BLADE) ×3 IMPLANT
BLADE SURG 15 STRL LF DISP TIS (BLADE) ×2 IMPLANT
BLADE SURG 15 STRL SS (BLADE) ×4
BNDG ELASTIC 6X15 VLCR STRL LF (GAUZE/BANDAGES/DRESSINGS) ×3 IMPLANT
BNDG ESMARK 6X9 LF (GAUZE/BANDAGES/DRESSINGS) ×3
BUR OVAL 4.0 (BURR) ×3 IMPLANT
BUR OVAL 6.0 (BURR) ×3 IMPLANT
CLOSURE WOUND 1/2 X4 (GAUZE/BANDAGES/DRESSINGS)
COVER SURGICAL LIGHT HANDLE (MISCELLANEOUS) ×3 IMPLANT
CUFF TOURNIQUET SINGLE 34IN LL (TOURNIQUET CUFF) ×3 IMPLANT
CUFF TOURNIQUET SINGLE 44IN (TOURNIQUET CUFF) IMPLANT
DECANTER SPIKE VIAL GLASS SM (MISCELLANEOUS) IMPLANT
DRAPE ARTHROSCOPY W/POUCH 114 (DRAPES) ×3 IMPLANT
DRAPE INCISE IOBAN 66X45 STRL (DRAPES) ×3 IMPLANT
DRAPE U-SHAPE 47X51 STRL (DRAPES) ×3 IMPLANT
DRILL FLIPCUTTER II 10MM (CUTTER) ×1 IMPLANT
DRSG PAD ABDOMINAL 8X10 ST (GAUZE/BANDAGES/DRESSINGS) ×3 IMPLANT
DURAPREP 26ML APPLICATOR (WOUND CARE) ×6 IMPLANT
ELECT REM PT RETURN 9FT ADLT (ELECTROSURGICAL) ×3
ELECTRODE REM PT RTRN 9FT ADLT (ELECTROSURGICAL) ×1 IMPLANT
FLIPCUTTER II 10MM (CUTTER) ×3
GAUZE SPONGE 4X4 12PLY STRL (GAUZE/BANDAGES/DRESSINGS) ×3 IMPLANT
GAUZE XEROFORM 1X8 LF (GAUZE/BANDAGES/DRESSINGS) ×3 IMPLANT
GLOVE BIOGEL PI IND STRL 8 (GLOVE) ×2 IMPLANT
GLOVE BIOGEL PI INDICATOR 8 (GLOVE) ×4
GLOVE ORTHO TXT STRL SZ7.5 (GLOVE) ×3 IMPLANT
GLOVE SURG ORTHO 8.0 STRL STRW (GLOVE) ×3 IMPLANT
GOWN SPEC L3 XXLG W/TWL (GOWN DISPOSABLE) ×3 IMPLANT
GOWN STRL REUS W/ TWL LRG LVL3 (GOWN DISPOSABLE) ×2 IMPLANT
GOWN STRL REUS W/TWL LRG LVL3 (GOWN DISPOSABLE) ×4
IMMOBILIZER KNEE 22 UNIV (SOFTGOODS) ×6 IMPLANT
KIT BASIN OR (CUSTOM PROCEDURE TRAY) ×3 IMPLANT
KIT ROOM TURNOVER OR (KITS) ×3 IMPLANT
MANIFOLD NEPTUNE II (INSTRUMENTS) ×3 IMPLANT
NEEDLE 18GX1X1/2 (RX/OR ONLY) (NEEDLE) ×3 IMPLANT
NS IRRIG 1000ML POUR BTL (IV SOLUTION) ×3 IMPLANT
PACK ARTHROSCOPY DSU (CUSTOM PROCEDURE TRAY) ×3 IMPLANT
PAD ARMBOARD 7.5X6 YLW CONV (MISCELLANEOUS) ×6 IMPLANT
PAD CAST 4YDX4 CTTN HI CHSV (CAST SUPPLIES) IMPLANT
PADDING CAST COTTON 4X4 STRL (CAST SUPPLIES)
PADDING CAST COTTON 6X4 STRL (CAST SUPPLIES) ×3 IMPLANT
PENCIL BUTTON HOLSTER BLD 10FT (ELECTRODE) ×3 IMPLANT
PK GRAFTLINK AUTO IMPLANT SYST (Anchor) ×3 IMPLANT
SET ARTHROSCOPY TUBING (MISCELLANEOUS) ×2
SET ARTHROSCOPY TUBING LN (MISCELLANEOUS) ×1 IMPLANT
SPONGE LAP 4X18 X RAY DECT (DISPOSABLE) ×6 IMPLANT
SPONGE SCRUB IODOPHOR (GAUZE/BANDAGES/DRESSINGS) ×3 IMPLANT
STRIP CLOSURE SKIN 1/2X4 (GAUZE/BANDAGES/DRESSINGS) IMPLANT
SUCTION FRAZIER TIP 10 FR DISP (SUCTIONS) ×3 IMPLANT
SUT 2 FIBERLOOP 20 STRT BLUE (SUTURE) ×3
SUT ETHILON 3 0 PS 1 (SUTURE) ×6 IMPLANT
SUT FIBERWIRE #2 38 T-5 BLUE (SUTURE) ×3
SUT PROLENE 3 0 PS 2 (SUTURE) ×3 IMPLANT
SUT VIC AB 0 CT1 27 (SUTURE) ×2
SUT VIC AB 0 CT1 27XBRD ANBCTR (SUTURE) ×1 IMPLANT
SUT VIC AB 2-0 CT1 27 (SUTURE) ×2
SUT VIC AB 2-0 CT1 TAPERPNT 27 (SUTURE) ×1 IMPLANT
SUT VICRYL 0 UR6 27IN ABS (SUTURE) IMPLANT
SUTURE 2 FIBERLOOP 20 STRT BLU (SUTURE) ×1 IMPLANT
SUTURE FIBERWR #2 38 T-5 BLUE (SUTURE) ×1 IMPLANT
SUTURE TIGERSTICK 2 TIGERWIR 2 (MISCELLANEOUS) ×1 IMPLANT
SYR 30ML LL (SYRINGE) ×3 IMPLANT
SYR BULB IRRIGATION 50ML (SYRINGE) ×3 IMPLANT
SYR TB 1ML LUER SLIP (SYRINGE) ×3 IMPLANT
SYSTEM GRAFT IMPLANT AUTOGRAFT (Anchor) ×1 IMPLANT
TIGERSTICK 2 TIGERWIRE 2 (MISCELLANEOUS) ×3
TOWEL OR 17X24 6PK STRL BLUE (TOWEL DISPOSABLE) ×3 IMPLANT
TOWEL OR 17X26 10 PK STRL BLUE (TOWEL DISPOSABLE) ×3 IMPLANT
UNDERPAD 30X30 INCONTINENT (UNDERPADS AND DIAPERS) IMPLANT
WAND HAND CNTRL MULTIVAC 90 (MISCELLANEOUS) ×3 IMPLANT
WATER STERILE IRR 1000ML POUR (IV SOLUTION) IMPLANT

## 2015-01-01 NOTE — Anesthesia Procedure Notes (Addendum)
Anesthesia Regional Block:  Femoral nerve block  Pre-Anesthetic Checklist: ,, timeout performed, Correct Patient, Correct Site, Correct Laterality, Correct Procedure, Correct Position, site marked, Risks and benefits discussed,  Surgical consent,  Pre-op evaluation,  At surgeon's request and post-op pain management  Laterality: Right  Prep: chloraprep       Needles:  Injection technique: Single-shot  Needle Type: Echogenic Stimulator Needle     Needle Length: 9cm 9 cm Needle Gauge: 21 and 21 G    Additional Needles:  Procedures: ultrasound guided (picture in chart) Femoral nerve block Narrative:  Injection made incrementally with aspirations every 5 mL.  Performed by: Personally   Additional Notes: Patient tolerated the procedure well without complications   Procedure Name: Intubation Date/Time: 01/01/2015 12:55 PM Performed by: Carmela RimaMARTINELLI, Talyn Eddie F Pre-anesthesia Checklist: Timeout performed, Patient being monitored, Suction available, Emergency Drugs available and Patient identified Patient Re-evaluated:Patient Re-evaluated prior to inductionOxygen Delivery Method: Circle system utilized Preoxygenation: Pre-oxygenation with 100% oxygen Intubation Type: IV induction Ventilation: Mask ventilation without difficulty Laryngoscope Size: Mac and 3 Grade View: Grade I Tube size: 7.5 mm Number of attempts: 1 Placement Confirmation: breath sounds checked- equal and bilateral,  positive ETCO2 and ETT inserted through vocal cords under direct vision Secured at: 22 cm Tube secured with: Tape Dental Injury: Teeth and Oropharynx as per pre-operative assessment

## 2015-01-01 NOTE — Progress Notes (Signed)
Orthopedic Tech Progress Note Patient Details:  Andrea Moon 06/02/1991 [id number]  Ortho Devices Type of Ortho Device: Crutches Ortho Device/Splint Interventions: Ordered, Adjustment   Jennye MoccasinHughes, Demani Mcbrien Craig 01/01/2015, 5:56 PM

## 2015-01-01 NOTE — Discharge Instructions (Signed)
Postoperative instructions:  Weightbearing: as tolerated in knee immobilizer  Keep your dressing and/or splint clean and dry at all times.  You can remove your dressing on post-operative day #3 and change with a dry/sterile dressing or Band-Aids as needed thereafter.    Incision instructions:  Do not soak your incision for 3 weeks after surgery.  If the incision gets wet, pat dry and do not scrub the incision.  Pain control:  You have been given a prescription to be taken as directed for post-operative pain control.  In addition, elevate the operative extremity above the heart at all times to prevent swelling and throbbing pain.  Take over-the-counter Colace, 100mg  by mouth twice a day while taking narcotic pain medications to help prevent constipation.  Follow up appointments: 1) 7 days for wound check.   -------------------------------------------------------------------------------------------------------------  After Surgery Pain Control:  After your surgery, post-surgical discomfort or pain is likely. This discomfort can last several days to a few weeks. At certain times of the day your discomfort may be more intense.  Did you receive a nerve block?  A nerve block can provide pain relief for one hour to two days after your surgery. As long as the nerve block is working, you will experience little or no sensation in the area the surgeon operated on.  As the nerve block wears off, you will begin to experience pain or discomfort. It is very important that you begin taking your prescribed pain medication before the nerve block fully wears off. Treating your pain at the first sign of the block wearing off will ensure your pain is better controlled and more tolerable when full-sensation returns. Do not wait until the pain is intolerable, as the medicine will be less effective. It is better to treat pain in advance than to try and catch up.  General Anesthesia:  If you did not receive a nerve  block during your surgery, you will need to start taking your pain medication shortly after your surgery and should continue to do so as prescribed by your surgeon.  Pain Medication:  Most commonly we prescribe Vicodin and Percocet for post-operative pain. Both of these medications contain a combination of acetaminophen (Tylenol) and a narcotic to help control pain.   It takes between 30 and 45 minutes before pain medication starts to work. It is important to take your medication before your pain level gets too intense.   Nausea is a common side effect of many pain medications. You will want to eat something before taking your pain medicine to help prevent nausea.   If you are taking a prescription pain medication that contains acetaminophen, we recommend that you do not take additional over the counter acetaminophen (Tylenol).  Other pain relieving options:   Using a cold pack to ice the affected area a few times a day (15 to 20 minutes at a time) can help to relieve pain, reduce swelling and bruising.   Elevation of the affected area can also help to reduce pain and swelling.

## 2015-01-01 NOTE — Op Note (Signed)
Date of procedure: 01/01/2015  Preoperative diagnosis: Right knee anterior cruciate ligament tear ex  Postoperative diagnosis: Same  Procedure: Arthroscopically assisted reconstruction of the anterior cruciate ligament of right knee with semitendinosus autograft  Surgeon: Glee ArvinMichael Xu, M.D.  Assistant: April Green, RNFA  Anesthesia: Gen. and regional  Estimated blood loss: Minimal  Tourniquet time: 1 hour 48 minutes  Complications: None  Condition to PACU: Stable  Indications for procedure: Andrea Moon is a 24 year old female who presents for surgical treatment of an anterior cruciate ligament tear. She was aware of the risks benefits alternatives to surgery and she wished to proceed. The consent was signed.  Description of procedure: The patient was identified in the preoperative holding area. The operative site was marked by the surgeon confirmed with the patient. She is brought back to the operating room. She was placed supine on the table. General anesthesia was induced. Nonsterile tourniquet was placed on the upper right thigh. Right lower extremity was prepped and draped in standard sterile fashion. Timeout was performed pre-incisional antibiotics were given. An exam under anesthesia of the right knee was performed and showed a positive pivot shift sign and a 2+ Lachman's with a soft endpoint. Extremity was exsanguinated using Esmarch bandage and the tourniquet was inflated to 350 mmHg. We first began with the hamstring harvesting. A longitudinal incision approximately 2 cm medial to the tibial tubercle was used. Blunt dissection was taken down to the level of the sartorius fascia. The semitendinosus tendon was identified. This was then isolated and adhesions were broken up using blunt dissection. Once we felt the adhesions had been broken up adequately we then used a tendon stripper to harvest the tendon. The tendon was then taken to the back table and prepared by my assistant. I then  continued with the arthroscopic portion. The standard anteromedial anterolateral portals of the knee were established. We visualized the anterior cruciate ligament tear. This was debrided using oscillating shaver. The femoral notch appeared to be quite narrow anatomically. Its we felt we had the anterior cruciate ligament footprint debrided we then performed a notchplasty with a high-speed burr. The medial aspect of the lateral femoral condyle was prepared. Once this was done we then found the proper tunnel on the femoral side using the guide and arthroscopic visualization. We then drilled a femoral tunnel approximately 25 mm in length and 10 mm in diameter. This was done using a flip cutter. We then performed the same on the tibia side. The medial arthroscopic portal was then dilated to accommodate the size of the graft. The prepared graft was 10 mm in diameter. We then first advanced the graft up the femoral tunnel under arthroscopic visualization with a button. The button was then flipped.  We then advanced approximately 15 mm of the graft into the femoral tunnel. We then advanced the graft into the tibial tunnel in a similar fashion. A large dogma was placed on the outside portion of the tibia. The graft was also advanced down into the tibial tunnel approximately 15 mm. We tightened down the graft.  We placed the knee in full extension and showed there was no impingement of the graft on the femoral notch. We then performed another exam under anesthesia and this time there was no pivot shift and her Lachman's was 1+ with a firm endpoint. The wounds were thoroughly irrigated and closed in layer fashion using 0 Vicryl for the fascia, 2-0 Vicryl for the subcutaneous layer and 3-0 nylon for the skin. Sterile dressings were applied.  The tourniquet was deflated. Patient was placed in the immobilizer and was extubated and transferred to the PACU in stable condition. All sponge counts were correct  Postoperative plan:  Patient will be weight bear as tolerated in a knee immobilizer. She will increase activity as tolerated. We'll see her back in the office in 1 week to start the CPM.  Mayra Reel, MD [facility] [phone] 3:30 PM

## 2015-01-01 NOTE — Anesthesia Preprocedure Evaluation (Addendum)
Anesthesia Evaluation  Patient identified by MRN, date of birth, ID band Patient awake    Reviewed: Allergy & Precautions, NPO status , Patient's Chart, lab work & pertinent test results  Airway Mallampati: II  TM Distance: <3 FB Neck ROM: Full    Dental no notable dental hx.    Pulmonary neg pulmonary ROS,  breath sounds clear to auscultation  Pulmonary exam normal       Cardiovascular negative cardio ROS  Rhythm:Regular Rate:Normal     Neuro/Psych negative neurological ROS  negative psych ROS   GI/Hepatic negative GI ROS, Neg liver ROS,   Endo/Other  Morbid obesity  Renal/GU negative Renal ROS  negative genitourinary   Musculoskeletal negative musculoskeletal ROS (+)   Abdominal   Peds negative pediatric ROS (+)  Hematology negative hematology ROS (+)   Anesthesia Other Findings   Reproductive/Obstetrics negative OB ROS                             Anesthesia Physical Anesthesia Plan  ASA: III  Anesthesia Plan: General   Post-op Pain Management:    Induction: Intravenous  Airway Management Planned: Oral ETT  Additional Equipment:   Intra-op Plan:   Post-operative Plan: Extubation in OR  Informed Consent: I have reviewed the patients History and Physical, chart, labs and discussed the procedure including the risks, benefits and alternatives for the proposed anesthesia with the patient or authorized representative who has indicated his/her understanding and acceptance.   Dental advisory given  Plan Discussed with: CRNA and Surgeon  Anesthesia Plan Comments:        Anesthesia Quick Evaluation

## 2015-01-01 NOTE — H&P (Signed)
PREOPERATIVE H&P  Chief Complaint: right knee anterior cruciate ligament tear  HPI: Andrea Moon is a 24 y.o. female who presents for surgical treatment of right knee anterior cruciate ligament tear.  She denies any changes in medical history.  Past Medical History  Diagnosis Date  . Anxiety     has had counselling for anxiety in the past   . Chronic kidney disease     renal calculi - x1 in the past- passed spontaneously  . ACL tear     right    Past Surgical History  Procedure Laterality Date  . No past surgeries     History   Social History  . Marital Status: Single    Spouse Name: N/A  . Number of Children: N/A  . Years of Education: N/A   Social History Main Topics  . Smoking status: Never Smoker   . Smokeless tobacco: Not on file  . Alcohol Use: Yes     Comment: "a few times a week"   . Drug Use: Yes    Special: Marijuana     Comment: everyday - at night   . Sexual Activity: Yes   Other Topics Concern  . Not on file   Social History Narrative   No family history on file. No Known Allergies Prior to Admission medications   Medication Sig Start Date End Date Taking? Authorizing Provider  ibuprofen (ADVIL,MOTRIN) 200 MG tablet Take 400 mg by mouth every 6 (six) hours as needed for moderate pain.   Yes Historical Provider, MD  cephALEXin (KEFLEX) 500 MG capsule Take 1 capsule (500 mg total) by mouth 3 (three) times daily. Patient not taking: Reported on 12/24/2014 07/20/13   Elenora GammaSamuel L Bradshaw, MD  HYDROcodone-acetaminophen (NORCO/VICODIN) 5-325 MG per tablet Take 2 tablets by mouth every 4 (four) hours as needed. Patient not taking: Reported on 12/24/2014 09/24/14   Rolland PorterMark James, MD  ibuprofen (ADVIL,MOTRIN) 800 MG tablet Take 1 tablet (800 mg total) by mouth 3 (three) times daily. Patient not taking: Reported on 12/24/2014 11/05/14   Doug SouSam Jacubowitz, MD  metroNIDAZOLE (FLAGYL) 500 MG tablet Take 1 tablet (500 mg total) by mouth 2 (two) times daily. Patient not  taking: Reported on 12/24/2014 07/20/13   Elenora GammaSamuel L Bradshaw, MD  phenazopyridine (PYRIDIUM) 200 MG tablet Take 1 tablet (200 mg total) by mouth 2 (two) times daily as needed for pain. Patient not taking: Reported on 12/24/2014 07/20/13   Elenora GammaSamuel L Bradshaw, MD     Positive ROS: All other systems have been reviewed and were otherwise negative with the exception of those mentioned in the HPI and as above.  Physical Exam: General: Alert, no acute distress Cardiovascular: No pedal edema Respiratory: No cyanosis, no use of accessory musculature GI: abdomen soft Skin: No lesions in the area of chief complaint Neurologic: Sensation intact distally Psychiatric: Patient is competent for consent with normal mood and affect Lymphatic: no lymphedema  MUSCULOSKELETAL: exam stable  Assessment: right knee anterior cruciate ligament tear  Plan: Plan for Procedure(s): RECONSTRUCTION RIGHT KNEE ANTERIOR CRUCIATE LIGAMENT (ACL) WITH HAMSTRING AUTOGRAFT  The risks benefits and alternatives were discussed with the patient including but not limited to the risks of nonoperative treatment, versus surgical intervention including infection, bleeding, nerve injury,  blood clots, cardiopulmonary complications, morbidity, mortality, among others, and they were willing to proceed.   Cheral AlmasXu, Naiping Michael, MD   01/01/2015 8:57 AM  '

## 2015-01-01 NOTE — Transfer of Care (Signed)
Immediate Anesthesia Transfer of Care Note  Patient: [name]  Procedure(s) Performed: Procedure(s): RECONSTRUCTION RIGHT KNEE ANTERIOR CRUCIATE LIGAMENT (ACL) WITH HAMSTRING AUTOGRAFT (Right)  Patient Location: PACU  Anesthesia Type:General  Level of Consciousness: awake, alert  and oriented  Airway & Oxygen Therapy: Patient Spontanous Breathing and Patient connected to nasal cannula oxygen  Post-op Assessment: Report given to RN, Post -op Vital signs reviewed and stable and Patient moving all extremities X 4  Post vital signs: Reviewed and stable  Last Vitals:  Filed Vitals:   01/01/15 1046  BP: 132/91  Pulse: 100  Temp: 36.9 C  Resp: 20    Complications: No apparent anesthesia complications

## 2015-01-02 ENCOUNTER — Encounter (HOSPITAL_COMMUNITY): Payer: Self-pay | Admitting: Orthopaedic Surgery

## 2015-01-02 NOTE — Anesthesia Postprocedure Evaluation (Signed)
  Anesthesia Post-op Note  Patient: [name]  Procedure(s) Performed: Procedure(s) (LRB): RECONSTRUCTION RIGHT KNEE ANTERIOR CRUCIATE LIGAMENT (ACL) WITH HAMSTRING AUTOGRAFT (Right)  Patient Location: PACU  Anesthesia Type: GA combined with regional for post-op pain  Level of Consciousness: awake and alert   Airway and Oxygen Therapy: Patient Spontanous Breathing  Post-op Pain: mild  Post-op Assessment: Post-op Vital signs reviewed, Patient's Cardiovascular Status Stable, Respiratory Function Stable, Patent Airway and No signs of Nausea or vomiting  Last Vitals:  Filed Vitals:   01/01/15 1735  BP: 150/92  Pulse: 107  Temp: 36.9 C  Resp: 21    Post-op Vital Signs: stable   Complications: No apparent anesthesia complications

## 2015-01-08 ENCOUNTER — Encounter (HOSPITAL_COMMUNITY): Payer: Self-pay | Admitting: Orthopaedic Surgery

## 2016-12-03 ENCOUNTER — Encounter (HOSPITAL_COMMUNITY): Payer: Self-pay | Admitting: Emergency Medicine

## 2016-12-03 ENCOUNTER — Ambulatory Visit (HOSPITAL_COMMUNITY)
Admission: EM | Admit: 2016-12-03 | Discharge: 2016-12-03 | Disposition: A | Discharge status: Other Acute Inpatient Hospital | Payer: BLUE CROSS/BLUE SHIELD

## 2016-12-03 ENCOUNTER — Emergency Department (HOSPITAL_COMMUNITY)
Admission: EM | Admit: 2016-12-03 | Discharge: 2016-12-03 | Disposition: A | Discharge status: Home / Self Care | Payer: BLUE CROSS/BLUE SHIELD | Attending: Emergency Medicine | Admitting: Emergency Medicine

## 2016-12-03 ENCOUNTER — Emergency Department (HOSPITAL_COMMUNITY): Payer: BLUE CROSS/BLUE SHIELD

## 2016-12-03 DIAGNOSIS — R1011 Right upper quadrant pain: Secondary | ICD-10-CM

## 2016-12-03 DIAGNOSIS — N189 Chronic kidney disease, unspecified: Secondary | ICD-10-CM | POA: Insufficient documentation

## 2016-12-03 DIAGNOSIS — Z7982 Long term (current) use of aspirin: Secondary | ICD-10-CM | POA: Insufficient documentation

## 2016-12-03 DIAGNOSIS — N390 Urinary tract infection, site not specified: Secondary | ICD-10-CM

## 2016-12-03 DIAGNOSIS — R1013 Epigastric pain: Secondary | ICD-10-CM

## 2016-12-03 DIAGNOSIS — K8020 Calculus of gallbladder without cholecystitis without obstruction: Secondary | ICD-10-CM

## 2016-12-03 LAB — LIPASE, BLOOD: Lipase: 18 U/L (ref 11–51)

## 2016-12-03 LAB — URINALYSIS, ROUTINE W REFLEX MICROSCOPIC
Bilirubin Urine: NEGATIVE
Glucose, UA: NEGATIVE mg/dL
Ketones, ur: NEGATIVE mg/dL
Leukocytes, UA: NEGATIVE
Nitrite: POSITIVE — AB
PH: 6 (ref 5.0–8.0)
Protein, ur: 30 mg/dL — AB
Specific Gravity, Urine: 1.018 (ref 1.005–1.030)

## 2016-12-03 LAB — CBC
HEMATOCRIT: 37 % (ref 36.0–46.0)
Hemoglobin: 12 g/dL (ref 12.0–15.0)
MCH: 25.8 pg — ABNORMAL LOW (ref 26.0–34.0)
MCHC: 32.4 g/dL (ref 30.0–36.0)
MCV: 79.4 fL (ref 78.0–100.0)
Platelets: 328 10*3/uL (ref 150–400)
RBC: 4.66 MIL/uL (ref 3.87–5.11)
RDW: 14.1 % (ref 11.5–15.5)
WBC: 11.1 10*3/uL — AB (ref 4.0–10.5)

## 2016-12-03 LAB — POC URINE PREG, ED: PREG TEST UR: NEGATIVE

## 2016-12-03 LAB — COMPREHENSIVE METABOLIC PANEL
ALK PHOS: 52 U/L (ref 38–126)
ALT: 26 U/L (ref 14–54)
AST: 23 U/L (ref 15–41)
Albumin: 3.8 g/dL (ref 3.5–5.0)
Anion gap: 10 (ref 5–15)
BILIRUBIN TOTAL: 0.5 mg/dL (ref 0.3–1.2)
BUN: 9 mg/dL (ref 6–20)
CHLORIDE: 103 mmol/L (ref 101–111)
CO2: 24 mmol/L (ref 22–32)
Calcium: 9.2 mg/dL (ref 8.9–10.3)
Creatinine, Ser: 0.59 mg/dL (ref 0.44–1.00)
GFR calc Af Amer: >60 mL/min (ref >60)
GLUCOSE: 110 mg/dL — AB (ref 65–99)
Potassium: 3.8 mmol/L (ref 3.5–5.1)
Sodium: 137 mmol/L (ref 135–145)
Total Protein: 7.1 g/dL (ref 6.5–8.1)

## 2016-12-03 MED ORDER — CEPHALEXIN 500 MG PO CAPS: 1000.0000 mg | ORAL_CAPSULE | Freq: Two times a day (BID) | ORAL | 0 refills | Status: AC

## 2016-12-03 MED ORDER — GI COCKTAIL ~~LOC~~
30.0000 mL | Freq: Once | ORAL | Status: AC
  Administered 2016-12-03: 30 mL via ORAL
  Filled 2016-12-03: qty 30

## 2016-12-03 MED ORDER — TRAMADOL HCL 50 MG PO TABS: 50.0000 mg | ORAL_TABLET | Freq: Four times a day (QID) | ORAL | 0 refills | Status: AC | PRN

## 2016-12-03 MED ORDER — ONDANSETRON HCL 4 MG/2ML IJ SOLN
4.0000 mg | Freq: Once | INTRAMUSCULAR | Status: AC
  Administered 2016-12-03: 4 mg via INTRAVENOUS
  Filled 2016-12-03: qty 2

## 2016-12-03 MED ORDER — RANITIDINE HCL 150 MG PO CAPS: 150.0000 mg | ORAL_CAPSULE | Freq: Two times a day (BID) | ORAL | 0 refills | Status: AC

## 2016-12-03 MED ORDER — OMEPRAZOLE 20 MG PO CPDR: 20.0000 mg | DELAYED_RELEASE_CAPSULE | Freq: Two times a day (BID) | ORAL | 0 refills | Status: AC

## 2016-12-03 MED ORDER — MORPHINE SULFATE (PF) 4 MG/ML IV SOLN
6.0000 mg | Freq: Once | INTRAVENOUS | Status: AC
  Administered 2016-12-03: 6 mg via INTRAVENOUS
  Filled 2016-12-03: qty 2

## 2016-12-03 NOTE — ED Provider Notes (Signed)
[facility]-EMERGENCY DEPT Provider Note   CSN: [id number] Arrival date & time: 12/03/16  1029     History   Chief Complaint Chief Complaint  Patient presents with  . Abdominal Pain    HPI Andrea Moon is a 26 y.o. female.  HPI   26 year old female presenting complaining of abdominal pain. Patient reports she was awoke this morning around 12:32 AM with intense burning sensation to her epigastric region that spread throughout her chest. States she became clammy, nauseous and did try to vomit a couple times. Her pain has since improved, currently rates as 6 out of 10. She has never had this kind of pain before. She denies any associated fever, chills, lightheadedness, dizziness, exertional chest pain, shortness of breath, productive cough, hemoptysis, dysuria, hematuria, vaginal bleeding vaginal discharge, constipation or diarrhea. Denies any rash. She ate a pesto pizza the night before. She denies any alcohol abuse. She did tries taking Tums, and ibuprofen earlier in the day with minimal improvement. Her last menstrual period was at the beginning of the week.  Past Medical History:  Diagnosis Date  . ACL tear    right   . Anxiety    has had counselling for anxiety in the past   . Chronic kidney disease    renal calculi - x1 in the past- passed spontaneously    There are no active problems to display for this patient.   Past Surgical History:  Procedure Laterality Date  . ANTERIOR CRUCIATE LIGAMENT REPAIR Right 01/01/2015   Procedure: RECONSTRUCTION RIGHT KNEE ANTERIOR CRUCIATE LIGAMENT (ACL) WITH HAMSTRING AUTOGRAFT;  Surgeon: Tarry Kos, MD;  Location: [facility] OR;  Service: Orthopedics;  Laterality: Right;  . NO PAST SURGERIES      OB History    No data available       Home Medications    Prior to Admission medications   Medication Sig Start Date End Date Taking? Authorizing Provider  aspirin EC 325 MG tablet Take 1 tablet (325 mg total) by mouth daily. 01/01/15   Naiping Donnelly Stager, MD  cephALEXin (KEFLEX) 500 MG capsule Take 1 capsule (500 mg total) by mouth 3 (three) times daily. Patient not taking: Reported on 12/24/2014 07/20/13   Elenora Gamma, MD  ibuprofen (ADVIL,MOTRIN) 200 MG tablet Take 400 mg by mouth every 6 (six) hours as needed for moderate pain.    Historical Provider, MD  ibuprofen (ADVIL,MOTRIN) 800 MG tablet Take 1 tablet (800 mg total) by mouth 3 (three) times daily. Patient not taking: Reported on 12/24/2014 11/05/14   Doug Sou, MD  methocarbamol (ROBAXIN) 750 MG tablet Take 1 tablet (750 mg total) by mouth 2 (two) times daily as needed for muscle spasms. 01/01/15   Naiping Donnelly Stager, MD  metroNIDAZOLE (FLAGYL) 500 MG tablet Take 1 tablet (500 mg total) by mouth 2 (two) times daily. Patient not taking: Reported on 12/24/2014 07/20/13   Elenora Gamma, MD  oxyCODONE (OXY IR/ROXICODONE) 5 MG immediate release tablet Take 1-3 tablets (5-15 mg total) by mouth every 4 (four) hours as needed. 01/01/15   Tarry Kos, MD  OxyCODONE (OXYCONTIN) 10 mg T12A 12 hr tablet Take 1 tablet (10 mg total) by mouth every 12 (twelve) hours. 01/01/15   Naiping Donnelly Stager, MD  phenazopyridine (PYRIDIUM) 200 MG tablet Take 1 tablet (200 mg total) by mouth 2 (two) times daily as needed for pain. Patient not taking: Reported on 12/24/2014 07/20/13   Elenora Gamma, MD  senna-docusate [facility] S) 8.6-50  MG per tablet Take 1 tablet by mouth at bedtime as needed. 01/01/15   Naiping Donnelly Stager, MD    Family History No family history on file.  Social History Social History  Substance Use Topics  . Smoking status: Never Smoker  . Smokeless tobacco: Not on file  . Alcohol use Yes     Comment: "a few times a week"      Allergies   Patient has no known allergies.   Review of Systems Review of Systems  All other systems reviewed and are negative.    Physical Exam Updated Vital Signs BP (!) 144/106   Pulse 86   Temp 98.4 F (36.9 C) (Oral)   Resp 17   LMP 12/03/2016    SpO2 99%   Physical Exam  Constitutional: She appears well-developed and well-nourished. No distress.  Mobility obese female laying in bed in no acute discomfort.  HENT:  Head: Atraumatic.  Mouth/Throat: Oropharynx is clear and moist.  Eyes: Conjunctivae are normal.  Neck: Neck supple.  Cardiovascular: Normal rate and regular rhythm.   Pulmonary/Chest: Effort normal and breath sounds normal.  Abdominal: Soft. Bowel sounds are normal. She exhibits no distension. There is tenderness (Mild epigastric tenderness without guarding or rebound tenderness. Negative Murphy sign, no pain at McBurney's point.).  Neurological: She is alert.  Skin: No rash noted.  Psychiatric: She has a normal mood and affect.  Nursing note and vitals reviewed.    ED Treatments / Results  Labs (all labs ordered are listed, but only abnormal results are displayed) Labs Reviewed  COMPREHENSIVE METABOLIC PANEL - Abnormal; Notable for the following:       Result Value   Glucose, Bld 110 (*)    All other components within normal limits  CBC - Abnormal; Notable for the following:    WBC 11.1 (*)    MCH 25.8 (*)    All other components within normal limits  URINALYSIS, ROUTINE W REFLEX MICROSCOPIC - Abnormal; Notable for the following:    APPearance HAZY (*)    Hgb urine dipstick LARGE (*)    Protein, ur 30 (*)    Nitrite POSITIVE (*)    Bacteria, UA MANY (*)    Squamous Epithelial / LPF 0-5 (*)    All other components within normal limits  URINE CULTURE  LIPASE, BLOOD  POC URINE PREG, ED    EKG  EKG Interpretation None       Radiology US Abdomen Limited Ruq  Result Date: 12/03/2016 CLINICAL DATA:  Upper abdominal pain since early this morning. EXAM: US ABDOMEN LIMITED - RIGHT UPPER QUADRANT COMPARISON:  CT, 07/20/2013 FINDINGS: Gallbladder: There are gallstones, 1 which remains fixed in the gallbladder neck, measuring 18 mm. There is no gallbladder wall thickening or pericholecystic fluid. Common  bile duct: Diameter: 5.7 mm Liver: Heterogeneous and increased echogenicity. Focal hypoechoic area in the left lobe measuring 4.4 x 3.0 x 1.7 cm, which may reflect an area of focal fatty sparing. No other liver lesion. IMPRESSION: 1. No acute finding. Cholelithiasis without evidence of acute cholecystitis. 2. Hepatic steatosis. Focal area of decreased liver echogenicity in the left lobe is likely focal fatty sparing, but nonspecific. Electronically Signed   By: Amie Portland M.D.   On: 12/03/2016 15:41    Procedures Procedures (including critical care time)  Medications Ordered in ED Medications  gi cocktail (Maalox,Lidocaine,Donnatal) (30 mLs Oral Given 12/03/16 1327)  morphine 4 MG/ML injection 6 mg (6 mg Intravenous Given 12/03/16 1429)  ondansetron (  ZOFRAN) injection 4 mg (4 mg Intravenous Given 12/03/16 1430)     Initial Impression / Assessment and Plan / ED Course  I have reviewed the triage vital signs and the nursing notes.  Pertinent labs & imaging results that were available during my care of the patient were reviewed by me and considered in my medical decision making (see chart for details).     BP (!) 139/96 (BP Location: Left Arm)   Pulse 89   Temp 98.4 F (36.9 C) (Oral)   Resp 17   LMP 12/03/2016   SpO2 99%    Final Clinical Impressions(s) / ED Diagnoses   Final diagnoses:  Epigastric pain  Acute UTI (urinary tract infection)  Calculus of gallbladder without cholecystitis without obstruction    New Prescriptions New Prescriptions   CEPHALEXIN (KEFLEX) 500 MG CAPSULE    Take 2 capsules (1,000 mg total) by mouth 2 (two) times daily.   OMEPRAZOLE (PRILOSEC) 20 MG CAPSULE    Take 1 capsule (20 mg total) by mouth 2 (two) times daily before a meal.   RANITIDINE (ZANTAC) 150 MG CAPSULE    Take 1 capsule (150 mg total) by mouth 2 (two) times daily.   TRAMADOL (ULTRAM) 50 MG TABLET    Take 1 tablet (50 mg total) by mouth every 6 (six) hours as needed for moderate pain.     12:58 PM Patient here with epigastric abdominal pain started early this morning since subsided. She does have mild epigastric tenderness on exam. Symptoms suggestive of gastritis. Abdominal exam is otherwise benign. She does not have any significant cardiac history. GI cocktail given. No low abnormal pain to suggest appendicitis. No cough or SOB to suggest cardiopulmonary etiology.   3:55 PM GI cocktail provides minimal improvement. An abdominal limited ultrasound obtain without any acute finding. Evidence of cholelithiasis without evidence of cholecystitis. I did discuss this finding with patient and encouraged patient to follow outpatient with general surgery for further evaluation. Patient's urine today shows RBCs too numerous to count as well as a nitrite positive finding with many bacteria. Patient is currently on her menstruation. She denies any significant dysuria but given this finding, I will obtain a urine culture and will treat patient with antibiotic. This is suspect his symptoms likely gastritis, and will prescribe patient a PPI and H2 blocker as well as a short course of pain medication. Encouraged patient to follow-up, Return precaution discussed.   Fayrene Helper, PA-C 12/03/16 1603    Mancel Bale, MD 12/03/16 [phone]

## 2016-12-03 NOTE — ED Notes (Signed)
Pt states  No pain improvement with GI cocktail.  PA notified.

## 2016-12-03 NOTE — ED Notes (Signed)
Pt did not need anything at this time Pt Family member at bedside

## 2016-12-03 NOTE — ED Notes (Signed)
Patient transported to Ultrasound 

## 2016-12-03 NOTE — ED Triage Notes (Signed)
Pt reports epigastric pain that began early this morning, reports a few episodes of vomiting, denies diarrhea. Pt ambulatory to triage, vss, resp e/u, nad.

## 2016-12-03 NOTE — Discharge Instructions (Signed)
You have evidence of gallstone on your ultrasound.  Please call and follow up with [facility] for outpatient management.  Take prilosec and zantac 30 minutes before each meal to help with your discomfort.  Your urine shows sign of a urinary tract infection, please take keflex as prescribed.  Return if you have any concerns or if your condition worsen.

## 2016-12-05 LAB — URINE CULTURE

## 2016-12-06 ENCOUNTER — Telehealth: Payer: Self-pay | Admitting: Emergency Medicine

## 2016-12-06 NOTE — Telephone Encounter (Signed)
Post ED Visit - Positive Culture Follow-up  Culture report reviewed by antimicrobial stewardship pharmacist:   Enzo Bi, Pharm.D.  Celedonio Miyamoto, Pharm.D., BCPS AQ-ID  Garvin Fila, Pharm.D., BCPS  Georgina Pillion, [address].D., BCPS  [place], [address].D., BCPS, AAHIVP  Estella Husk, Pharm.D., BCPS, AAHIVP  Lysle Pearl, PharmD, BCPS  Casilda Carls, PharmD, BCPS  Pollyann Samples, PharmD, BCPS  Positive urine culture Treated with cephalexin, organism sensitive to the same and no further patient follow-up is required at this time.  Berle Mull 12/06/2016, 12:53 PM

## 2018-06-27 IMAGING — US US ABDOMEN LIMITED
1 series · 14 of 25 positions shown · non-contrast
Comparison: CT, 07/20/2013

CLINICAL DATA: Upper abdominal pain since early this morning.

EXAM:
US ABDOMEN LIMITED - RIGHT UPPER QUADRANT

[Series 1: us abdomen limited · 0.30mm/px · 14 of 53 slices shown]
[im 1/53]
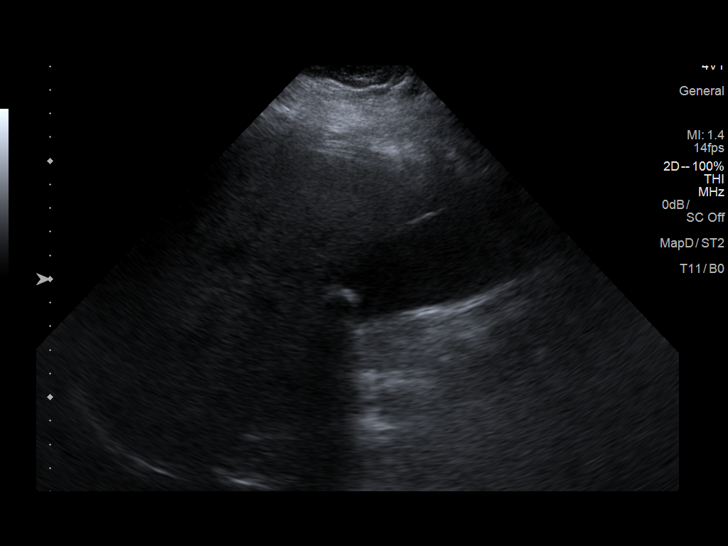
[im 5/53]
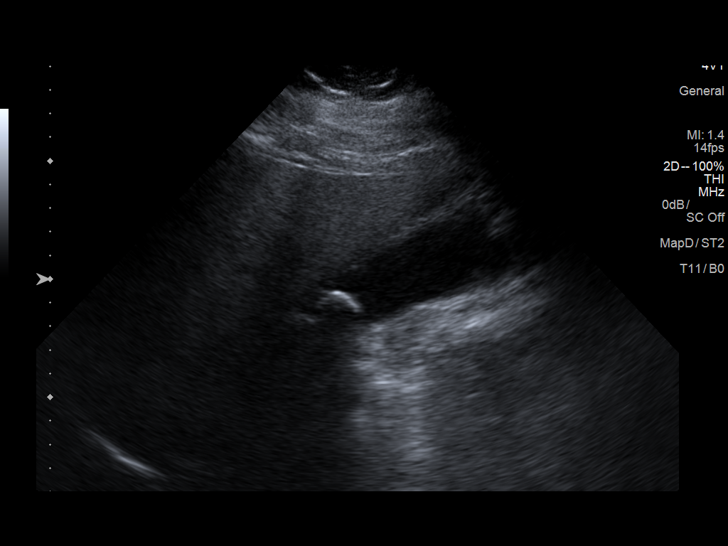
[im 9/53]
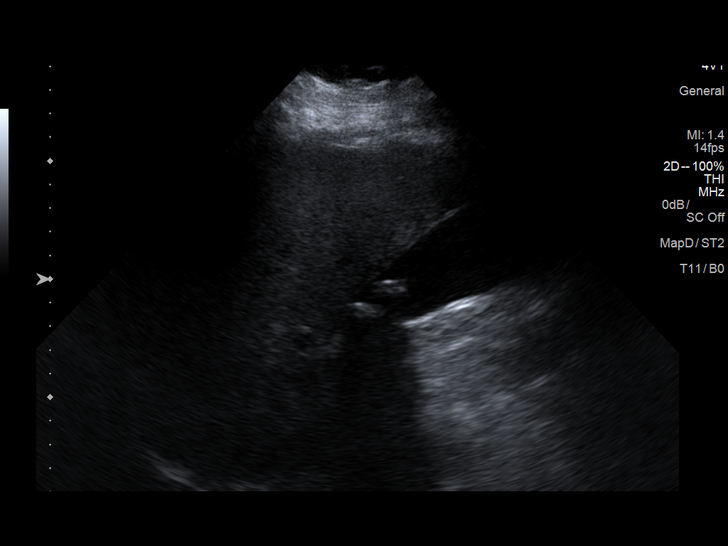
[im 14/53]
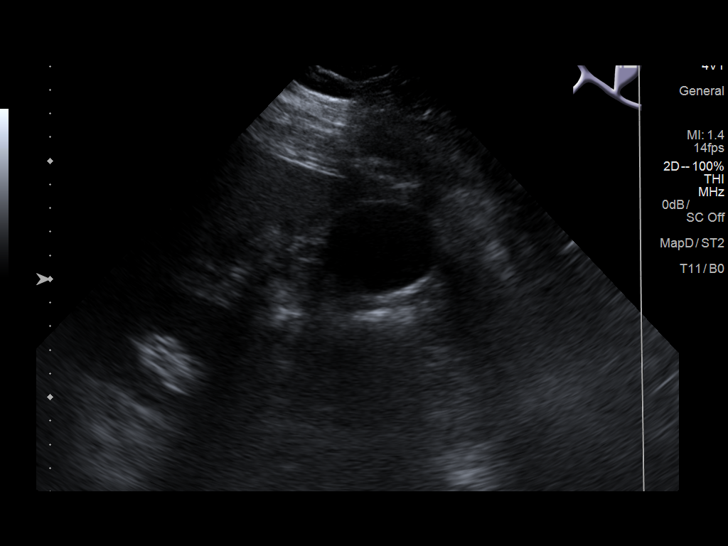
[im 18/53]
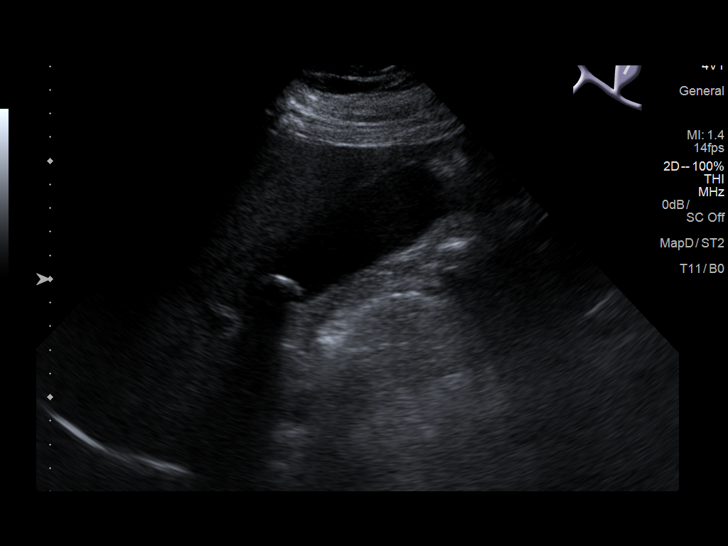
[im 20/53]
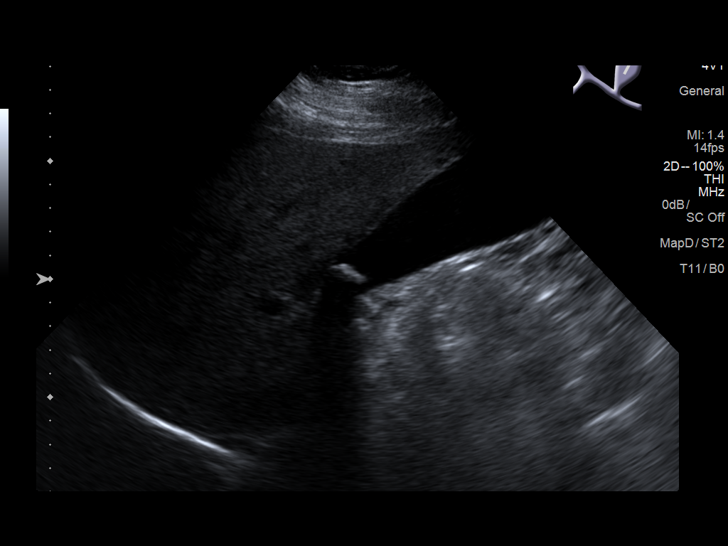
[im 24/53]
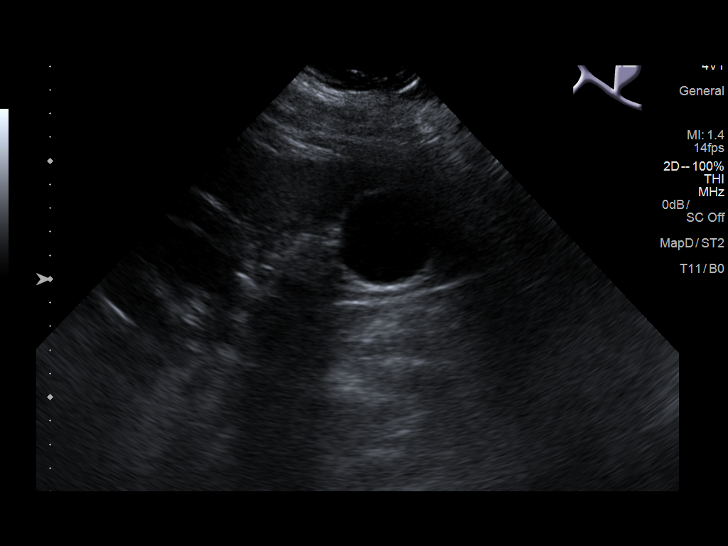
[im 29/53]
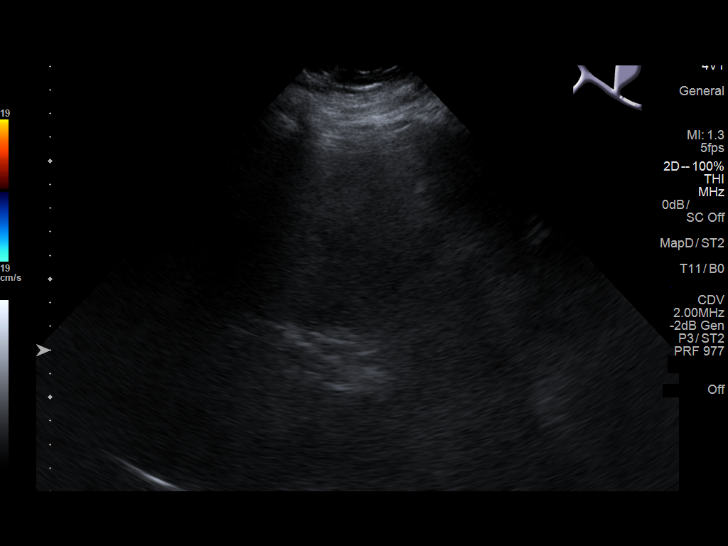
[im 33/53]
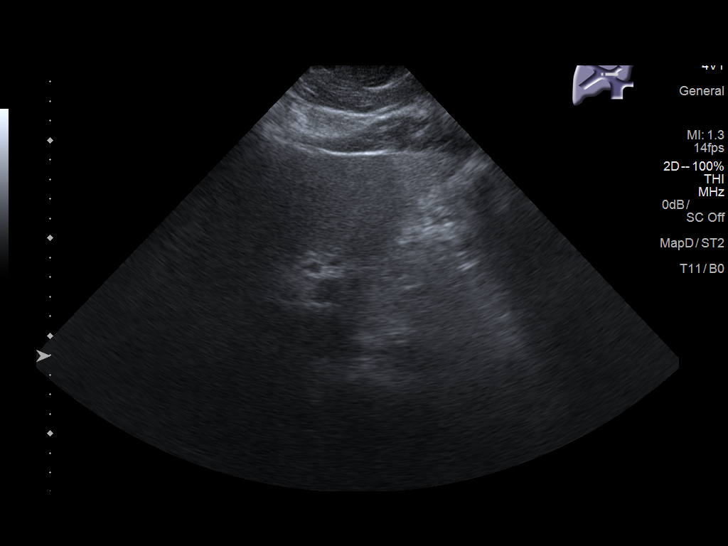
[im 35/53]
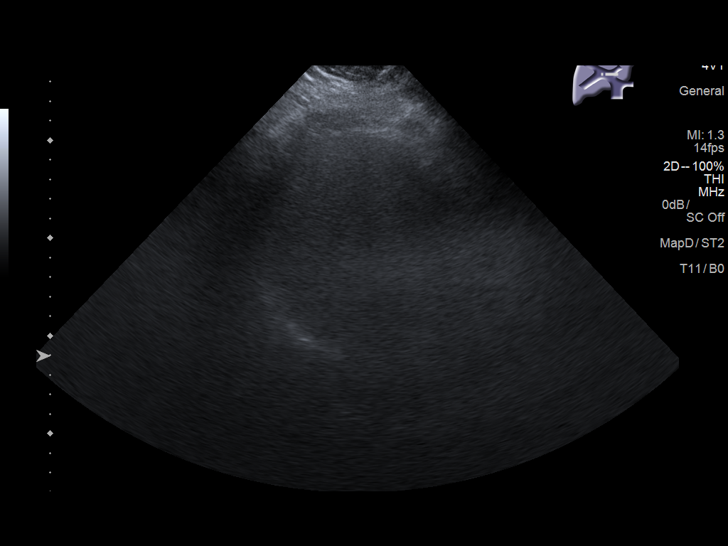
[im 40/53]
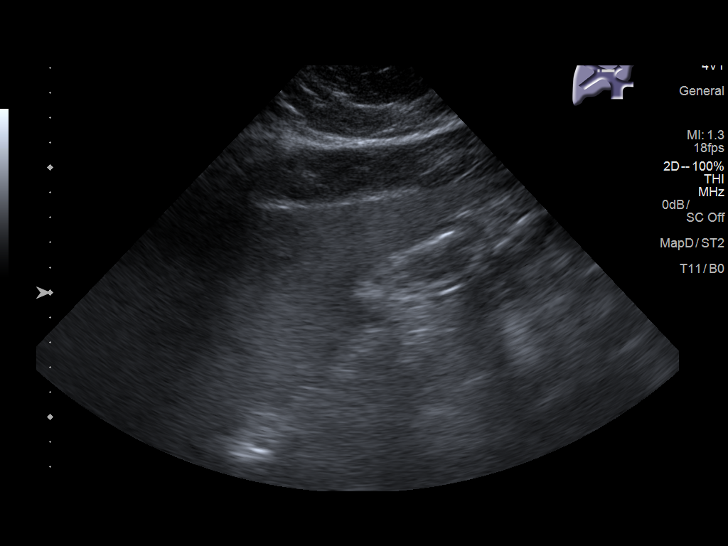
[im 44/53]
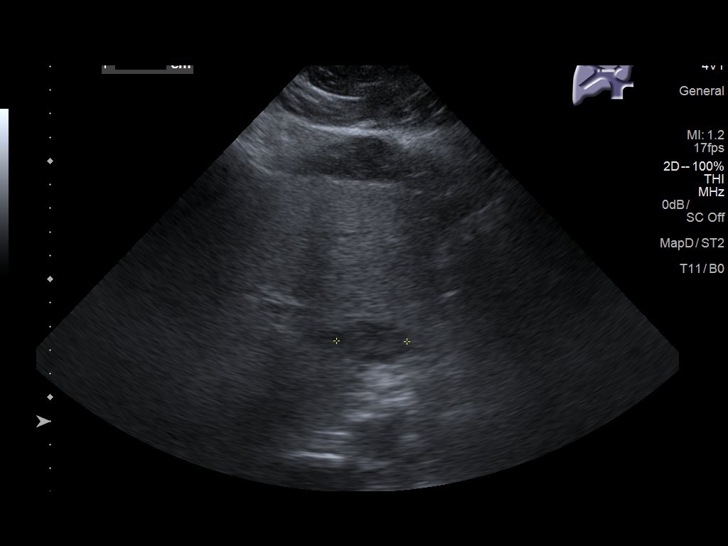
[im 48/53]
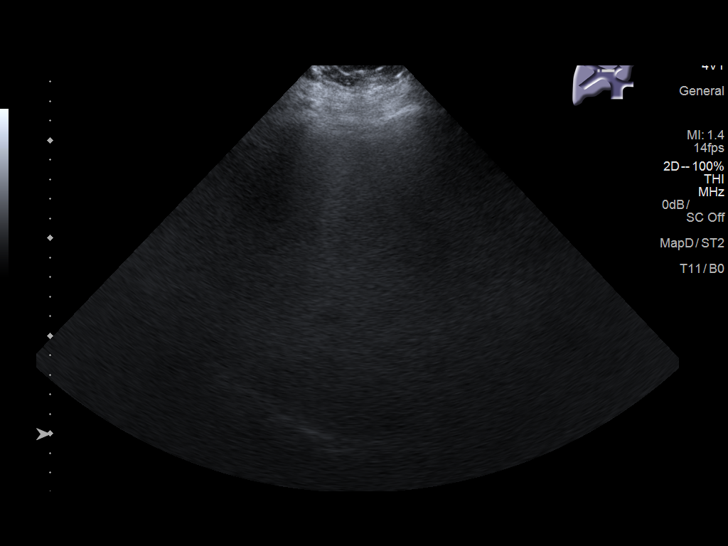
[im 53/53]
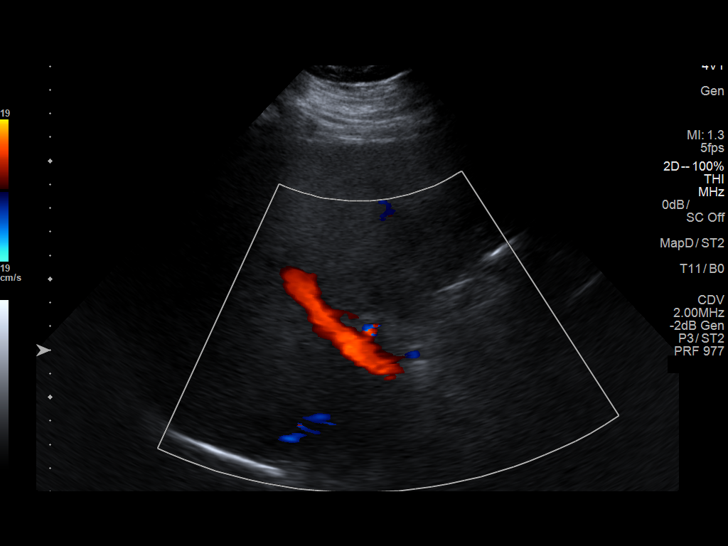

[14 of 25 positions shown; findings below may reference images not displayed]

FINDINGS: Gallbladder:

There are gallstones, 1 which remains fixed in the gallbladder neck,
measuring 18 mm. There is no gallbladder wall thickening or
pericholecystic fluid.

Common bile duct:

Diameter: 5.7 mm

Liver:

Heterogeneous and increased echogenicity. Focal hypoechoic area in
the left lobe measuring 4.4 x 3.0 x 1.7 cm, which may reflect an
area of focal fatty sparing. No other liver lesion.
IMPRESSION: 1. No acute finding. Cholelithiasis without evidence of acute
cholecystitis.
2. Hepatic steatosis. Focal area of decreased liver echogenicity in
the left lobe is likely focal fatty sparing, but nonspecific.
